# Patient Record
Sex: Female | Born: 2000 | Race: Black or African American | Hispanic: No | Marital: Single | Smoking: Former smoker
Health system: Southern US, Community
[De-identification: ages and names within clinical notes are randomized; demographics above are authoritative.]

## PROBLEM LIST (undated history)

## (undated) ENCOUNTER — Inpatient Hospital Stay (HOSPITAL_COMMUNITY): Payer: Self-pay

## (undated) DIAGNOSIS — O039 Complete or unspecified spontaneous abortion without complication: Secondary | ICD-10-CM

## (undated) DIAGNOSIS — Z789 Other specified health status: Secondary | ICD-10-CM

## (undated) HISTORY — DX: Complete or unspecified spontaneous abortion without complication: O03.9

## (undated) HISTORY — PX: TYMPANOSTOMY TUBE PLACEMENT: SHX32

---
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
## 2018-03-31 ENCOUNTER — Emergency Department (HOSPITAL_COMMUNITY)
Admission: EM | Admit: 2018-03-31 | Discharge: 2018-03-31 | Disposition: A | Discharge status: Home / Self Care | Payer: Medicaid Other | Attending: Physician Assistant | Admitting: Physician Assistant

## 2018-03-31 ENCOUNTER — Other Ambulatory Visit: Payer: Self-pay

## 2018-03-31 ENCOUNTER — Emergency Department (HOSPITAL_COMMUNITY): Payer: Medicaid Other

## 2018-03-31 ENCOUNTER — Encounter (HOSPITAL_COMMUNITY): Payer: Self-pay | Admitting: Emergency Medicine

## 2018-03-31 DIAGNOSIS — R079 Chest pain, unspecified: Secondary | ICD-10-CM | POA: Diagnosis present

## 2018-03-31 DIAGNOSIS — R059 Cough, unspecified: Secondary | ICD-10-CM

## 2018-03-31 DIAGNOSIS — R0789 Other chest pain: Secondary | ICD-10-CM

## 2018-03-31 DIAGNOSIS — R05 Cough: Secondary | ICD-10-CM | POA: Diagnosis not present

## 2018-03-31 DIAGNOSIS — Z79899 Other long term (current) drug therapy: Secondary | ICD-10-CM | POA: Diagnosis not present

## 2018-03-31 LAB — CBC
HCT: 40.5 % (ref 36.0–46.0)
HEMOGLOBIN: 12.8 g/dL (ref 12.0–15.0)
MCH: 27.9 pg (ref 26.0–34.0)
MCHC: 31.6 g/dL (ref 30.0–36.0)
MCV: 88.4 fL (ref 78.0–100.0)
Platelets: 345 10*3/uL (ref 150–400)
RBC: 4.58 MIL/uL (ref 3.87–5.11)
RDW: 12.7 % (ref 11.5–15.5)
WBC: 9.1 10*3/uL (ref 4.0–10.5)

## 2018-03-31 LAB — BASIC METABOLIC PANEL
ANION GAP: 9 (ref 5–15)
BUN: 5 mg/dL — AB (ref 6–20)
CALCIUM: 9.1 mg/dL (ref 8.9–10.3)
CO2: 24 mmol/L (ref 22–32)
Chloride: 104 mmol/L (ref 101–111)
Creatinine, Ser: 0.62 mg/dL (ref 0.44–1.00)
GFR calc Af Amer: >60 mL/min (ref >60)
GFR calc non Af Amer: >60 mL/min (ref >60)
GLUCOSE: 115 mg/dL — AB (ref 65–99)
Potassium: 3.6 mmol/L (ref 3.5–5.1)
Sodium: 137 mmol/L (ref 135–145)

## 2018-03-31 LAB — I-STAT TROPONIN, ED: Troponin i, poc: 0 ng/mL (ref 0.00–0.08)

## 2018-03-31 LAB — I-STAT BETA HCG BLOOD, ED (MC, WL, AP ONLY): I-stat hCG, quantitative: <5 m[IU]/mL (ref <5)

## 2018-03-31 MED ORDER — IBUPROFEN 800 MG PO TABS: 800.0000 mg | ORAL_TABLET | Freq: Three times a day (TID) | ORAL | 0 refills | Status: DC | PRN

## 2018-03-31 NOTE — ED Notes (Signed)
Pt stated that she has been using a nasal spray as well as an OTC med "Top Care" for recent congestion - cold

## 2018-03-31 NOTE — ED Triage Notes (Signed)
Pt reports chest pain intermittently x2 days, reports he mom is upstairs admitted to the hospital with a heart attack. Pt appears anxious

## 2018-03-31 NOTE — ED Notes (Signed)
While ambulating pt's oxygen dropped to 95 for a brief second and the heart rate went from 97 up to 110 then went down then up again while walking around [place] hallway once

## 2018-03-31 NOTE — ED Provider Notes (Signed)
[facility] EMERGENCY DEPARTMENT Provider Note   CSN: [id number] Arrival date & time: 03/31/18  1632     History   Chief Complaint Chief Complaint  Patient presents with  . Chest Pain    HPI Carrie Lyons is a 17 y.o. female.  HPI Patient presents to the emergency department with chest discomfort that started 2 days ago.  The patient states that her chest pain lasts for seconds at a time and then goes away.  She states the pain is not constant.  It is of breath or exertional symptoms.  Patient states she did start with a cough 3 days ago.  She states that certain positions do make the pain worse.  Patient states that she did not take any medications prior to arrival for symptoms.  Patient denies having any medical problems.  She states that she does not have any family history of early onset.  The patient denies  shortness of breath, headache,blurred vision, neck pain, fever,  weakness, numbness, dizziness, anorexia, edema, abdominal pain, nausea, vomiting, diarrhea, rash, back pain, dysuria, hematemesis, bloody stool, near syncope, or syncope. History reviewed. No pertinent past medical history.  There are no active problems to display for this patient.   Past Surgical History:  Procedure Laterality Date  . TYMPANOSTOMY TUBE PLACEMENT       OB History   None      Home Medications    Prior to Admission medications   Medication Sig Start Date End Date Taking? Authorizing Provider  ferrous sulfate 325 (65 FE) MG tablet Take 1 tablet (325 mg total) by mouth daily. 08/31/17   Jeannett Senior, PA-C    Family History No family history on file.  Social History Social History   Tobacco Use  . Smoking status: Never Smoker  . Smokeless tobacco: Never Used  Substance Use Topics  . Alcohol use: No  . Drug use: Not Currently     Allergies   Amoxicillin and Penicillins   Review of Systems Review of Systems All other systems negative except as  documented in the HPI. All pertinent positives and negatives as reviewed in the HPI.  Physical Exam Updated Vital Signs BP (!) 151/98 (BP Location: Right Arm)   Pulse (!) 112   Temp 99.2 F (37.3 C)   Resp 20   Ht 5\' 4"  (1.626 m)   Wt 61.2 kg (135 lb)   LMP 03/10/2018   SpO2 100%   BMI 23.17 kg/m   Physical Exam  Constitutional: She is oriented to person, place, and time. She appears well-developed and well-nourished. No distress.  HENT:  Head: Normocephalic and atraumatic.  Mouth/Throat: Oropharynx is clear and moist.  Eyes: Pupils are equal, round, and reactive to light.  Neck: Normal range of motion. Neck supple.  Cardiovascular: Normal rate, regular rhythm and normal heart sounds. Exam reveals no gallop and no friction rub.  No murmur heard. Pulmonary/Chest: Effort normal and breath sounds normal. No respiratory distress. She has no wheezes. She has no rhonchi. She has no rales.  Abdominal: Soft. Bowel sounds are normal. She exhibits no distension. There is no tenderness.  Neurological: She is alert and oriented to person, place, and time. She exhibits normal muscle tone. Coordination normal.  Skin: Skin is warm and dry. Capillary refill takes less than 2 seconds. No rash noted. No erythema.  Psychiatric: She has a normal mood and affect. Her behavior is normal.  Nursing note and vitals reviewed.    ED Treatments /  Results  Labs (all labs ordered are listed, but only abnormal results are displayed) Labs Reviewed  BASIC METABOLIC PANEL - Abnormal; Notable for the following components:      Result Value   Glucose, Bld 115 (*)    BUN 5 (*)    All other components within normal limits  CBC  I-STAT TROPONIN, ED  I-STAT BETA HCG BLOOD, ED ([facility], [facility], [facility] ONLY)    EKG None  Radiology Dg Chest 2 View  Result Date: 03/31/2018 CLINICAL DATA:  Intermittent chest pain for the past 2 days. EXAM: CHEST - 2 VIEW COMPARISON:  08/31/2017. FINDINGS: The heart size and mediastinal  contours are within normal limits. Both lungs are clear. The visualized skeletal structures are unremarkable. IMPRESSION: Normal examination. Electronically Signed   By: Claudie Revering M.D.   On: 03/31/2018 17:43    Procedures Procedures (including critical care time)  Medications Ordered in ED Medications - No data to display   Initial Impression / Assessment and Plan / ED Course  I have reviewed the triage vital signs and the nursing notes.  Pertinent labs & imaging results that were available during my care of the patient were reviewed by me and considered in my medical decision making (see chart for details).   The patient has atypical symptoms for cardiac chest pain.  Patient is PERC negative and low risk based on Wells criteria.  The patient is young and healthy and she does not smoke.  The patient is not on any birth control.  I feel that the patient's symptoms of pain that lasts for seconds at a time seems pretty atypical for any significant intra-thoracic or cardiac issue.  Patient does not have any shortness of breath associated with this.     Final Clinical Impressions(s) / ED Diagnoses   Final diagnoses:  None    ED Discharge Orders    None       Dalia Heading, PA-C 03/31/18 2030    Macarthur Critchley, MD 04/02/18 1553

## 2018-03-31 NOTE — Discharge Instructions (Addendum)
Return here as needed.  Follow-up with a primary doctor.  If you notice any changes or worsening in your condition you will need to be rechecked.

## 2018-10-29 ENCOUNTER — Inpatient Hospital Stay (HOSPITAL_COMMUNITY)
Admission: AD | Admit: 2018-10-29 | Discharge: 2018-10-29 | Disposition: A | Discharge status: Home / Self Care | Payer: BLUE CROSS/BLUE SHIELD | Attending: Obstetrics and Gynecology | Admitting: Obstetrics and Gynecology

## 2018-10-29 ENCOUNTER — Inpatient Hospital Stay (HOSPITAL_COMMUNITY): Payer: BLUE CROSS/BLUE SHIELD

## 2018-10-29 ENCOUNTER — Encounter (HOSPITAL_COMMUNITY): Payer: Self-pay | Admitting: *Deleted

## 2018-10-29 DIAGNOSIS — R109 Unspecified abdominal pain: Secondary | ICD-10-CM | POA: Diagnosis present

## 2018-10-29 DIAGNOSIS — R030 Elevated blood-pressure reading, without diagnosis of hypertension: Secondary | ICD-10-CM | POA: Diagnosis not present

## 2018-10-29 DIAGNOSIS — O26891 Other specified pregnancy related conditions, first trimester: Secondary | ICD-10-CM | POA: Insufficient documentation

## 2018-10-29 DIAGNOSIS — Z87891 Personal history of nicotine dependence: Secondary | ICD-10-CM | POA: Diagnosis not present

## 2018-10-29 DIAGNOSIS — Z3A01 Less than 8 weeks gestation of pregnancy: Secondary | ICD-10-CM | POA: Insufficient documentation

## 2018-10-29 DIAGNOSIS — Z880 Allergy status to penicillin: Secondary | ICD-10-CM | POA: Insufficient documentation

## 2018-10-29 LAB — URINALYSIS, ROUTINE W REFLEX MICROSCOPIC
Bilirubin Urine: NEGATIVE
Glucose, UA: NEGATIVE mg/dL
Hgb urine dipstick: NEGATIVE
KETONES UR: NEGATIVE mg/dL
LEUKOCYTES UA: NEGATIVE
NITRITE: NEGATIVE
PROTEIN: NEGATIVE mg/dL
Specific Gravity, Urine: 1.01 (ref 1.005–1.030)
pH: 7 (ref 5.0–8.0)

## 2018-10-29 LAB — POCT PREGNANCY, URINE: Preg Test, Ur: POSITIVE — AB

## 2018-10-29 LAB — HCG, QUANTITATIVE, PREGNANCY: HCG, BETA CHAIN, QUANT, S: 1049 m[IU]/mL — AB (ref <5)

## 2018-10-29 MED ORDER — PROMETHAZINE HCL 25 MG PO TABS: 25.0000 mg | ORAL_TABLET | Freq: Four times a day (QID) | ORAL | 0 refills | Status: DC | PRN

## 2018-10-29 MED ORDER — PROMETHAZINE HCL 25 MG PO TABS
25.0000 mg | ORAL_TABLET | Freq: Once | ORAL | Status: AC
  Administered 2018-10-29: 25 mg via ORAL
  Filled 2018-10-29: qty 1

## 2018-10-29 NOTE — MAU Provider Note (Signed)
History     CSN: [id number]  Arrival date and time: 10/29/18 4650   First Provider Initiated Contact with Patient 10/29/18 [phone]      Chief Complaint  Patient presents with  . Abdominal Pain  . Nausea   Carrie Lyons is a 18 y.o. G1P0 at [redacted]w[redacted]d by exact LMP who presents for Abdominal Pain and Nausea.  Patient states she has been experiencing her symptoms for about 1 week. She states she is not taking any medications and tries to "sleep it off," and endorses that this works sometimes.  She reports that the abdominal pain is cramping, mainly in the right lower area and is intermittent.  She also reports that the nausea is all day, but she has vomiting in the morning or at night.  Patient denies fever, chills, diarrhea, or problems with urination.  She endorses constipation for the past 2 weeks and reports having a bowel movement yesterday that was not hard to pass. Patient endorses frequent headaches in the last week that last ~1 hour and go away without apparent intervention.  She denies a history of medical issues/concerns, but has not seen a primary care provider in ~2 years.      OB History    Gravida  1   Para      Term      Preterm      AB      Living        SAB      TAB      Ectopic      Multiple      Live Births              No past medical history on file.  Past Surgical History:  Procedure Laterality Date  . TYMPANOSTOMY TUBE PLACEMENT      History reviewed. No pertinent family history.  Social History   Tobacco Use  . Smoking status: Former Research scientist (life sciences)  . Smokeless tobacco: Never Used  Substance Use Topics  . Alcohol use: No  . Drug use: Not Currently    Allergies:  Allergies  Allergen Reactions  . Amoxicillin Hives    Has patient had a PCN reaction causing immediate rash, facial/tongue/throat swelling, SOB or lightheadedness with hypotension: Unknown Has patient had a PCN reaction causing severe rash involving mucus membranes or skin  necrosis: Unknown Has patient had a PCN reaction that required hospitalization: Unknown Has patient had a PCN reaction occurring within the last 10 years: Yes If all of the above answers are "NO", then may proceed with Cephalosporin use.   Marland Kitchen Penicillins Hives    Has patient had a PCN reaction causing immediate rash, facial/tongue/throat swelling, SOB or lightheadedness with hypotension: Unknown Has patient had a PCN reaction causing severe rash involving mucus membranes or skin necrosis: Unknown Has patient had a PCN reaction that required hospitalization: Unknown Has patient had a PCN reaction occurring within the last 10 years: Yes If all of the above answers are "NO", then may proceed with Cephalosporin use.     Medications Prior to Admission  Medication Sig Dispense Refill Last Dose  . ferrous sulfate 325 (65 FE) MG tablet Take 325 mg by mouth daily with breakfast.   Past Month at Unknown time    Review of Systems  Constitutional: Negative for chills and fever.  Eyes: Negative for photophobia and visual disturbance.  Respiratory: Negative for shortness of breath.   Gastrointestinal: Positive for abdominal pain, constipation and nausea. Negative for diarrhea and  vomiting.  Genitourinary: Negative for dysuria, vaginal bleeding and vaginal discharge.  Neurological: Negative for dizziness, light-headedness and headaches.   Physical Exam   Blood pressure (!) 147/85, pulse 80, temperature 98.5 F (36.9 C), temperature source Oral, resp. rate 17, height 5\' 4"  (1.626 m), weight 65.5 kg, last menstrual period 09/16/2018.  Vitals:   10/29/18 0907 10/29/18 0918 10/29/18 0922 10/29/18 1208  BP:  (!) 141/84 (!) 147/85 136/85  Pulse:  74 80 85  Resp:  17  16  Temp:  98.5 F (36.9 C)    TempSrc:  Oral    Weight: 65.5 kg     Height: 5\' 4"  (1.626 m)       Results for orders placed or performed during the hospital encounter of 10/29/18 (from the past 24 hour(s))  Urinalysis, Routine w  reflex microscopic     Status: None   Collection Time: 10/29/18  9:36 AM  Result Value Ref Range   Color, Urine YELLOW YELLOW   APPearance CLEAR CLEAR   Specific Gravity, Urine 1.010 1.005 - 1.030   pH 7.0 5.0 - 8.0   Glucose, UA NEGATIVE NEGATIVE mg/dL   Hgb urine dipstick NEGATIVE NEGATIVE   Bilirubin Urine NEGATIVE NEGATIVE   Ketones, ur NEGATIVE NEGATIVE mg/dL   Protein, ur NEGATIVE NEGATIVE mg/dL   Nitrite NEGATIVE NEGATIVE   Leukocytes, UA NEGATIVE NEGATIVE  Pregnancy, urine POC     Status: Abnormal   Collection Time: 10/29/18  9:41 AM  Result Value Ref Range   Preg Test, Ur POSITIVE (A) NEGATIVE  hCG, quantitative, pregnancy     Status: Abnormal   Collection Time: 10/29/18 12:01 PM  Result Value Ref Range   hCG, Beta Chain, Quant, S 1,049 (H) <5 mIU/mL     Physical Exam  Constitutional: She is oriented to person, place, and time. She appears well-developed and well-nourished. No distress.  HENT:  Head: Normocephalic and atraumatic.  Eyes: Conjunctivae are normal.  Neck: Normal range of motion.  Cardiovascular: Normal rate, regular rhythm and normal heart sounds.  Respiratory: Effort normal and breath sounds normal.  GI: Soft. Bowel sounds are normal. She exhibits no distension. There is no abdominal tenderness. There is no rebound.  Musculoskeletal: Normal range of motion.        General: No edema.  Neurological: She is alert and oriented to person, place, and time.  Skin: Skin is warm and dry.  Psychiatric: She has a normal mood and affect. Her behavior is normal.    MAU Course  Procedures US FINDINGS: Intrauterine gestational sac: Visualized  Yolk sac:  Not visualized  Embryo:  Not visualized  Cardiac Activity: Not visualized  MSD: 6 mm   5 w   2 d  Subchorionic hemorrhage:  None visualized.  Maternal uterus/adnexae: Cervical os is closed. Right ovary measures 2.6 x 1.7 x 2.0 cm in size. Left ovary measures 3.8 x 2.1 x 3.4 cm in size. No  extrauterine pelvic mass. There is a small amount of free pelvic fluid.  IMPRESSION: Probable early intrauterine gestational sac, but no yolk sac, fetal pole, or cardiac activity yet visualized. Recommend follow-up quantitative B-HCG levels and follow-up US in 14 days to assess viability. This recommendation follows SRU consensus guidelines:  Diagnostic Criteria for Nonviable Pregnancy Early in the First Trimester. [employer] Med 2013; 628:3662-94. based on gestational sac size, estimated gestational age is 5+ weeks.  Small amount of free fluid in the cul-de-sac which may be upper physiologic. No extrauterine pelvic mass.  No intrauterine lesions beyond apparent small gestational sac.  MDM PE AntiEmetic Viability [place] Education Labs: CBC, hCG,  Assessment and Plan  +UPT at 6.1wks by Definite LMP Right Side Abdominal Pain Nausea Elevated BP  -Informed of positive UPT, congratulations given -Discussed phenergan usage for symptoms of nausea -Discussed need for [place] to confirm IUP based on definite LMP -Educated on elevated blood pressure at this visit and past visits to [facility] facility (03/2018 and 08/2017). *Patient reports no history of blood pressure issues *Mother at bedside,  reports personal history of heart attack -Informed that BP will need to be monitored and further elevations would warrant a formal diagnosis of CHTN -Will send for [place] and follow up once results return.   Follow Up (11:59 AM) IUP at 5.2wks   -[place] results discussed. -Will obtain quant -Informed of need for follow up with hCG and [place]. -FU quant scheduled for Wednes. Jan 22 at 1100am at [facility]. -Rx for PNV and phenergan 25mg  q6hrs prn, Disp 30, RF 0 -Discussed obtaining primary obstetrician and extended invitation for care at [facility]. -No questions/concerns -Encouraged to call or return to MAU if symptoms worsen or with the onset of new symptoms. -Discharged to home in stable condition  [name],  [name] 10/29/2018, 9:54 AM

## 2018-10-29 NOTE — Progress Notes (Signed)
Notify RN

## 2018-10-29 NOTE — MAU Note (Signed)
Patient c/o lower abdominal cramping and nausea for past week.  Has not vomited, but felt nauseated.  Unsure if pregnant. Has not taken HPT.  LMP 09/16/18.

## 2018-10-29 NOTE — Discharge Instructions (Signed)
First Trimester of Pregnancy °The first trimester of pregnancy is from week 1 until the end of week 13 (months 1 through 3). A week after a sperm fertilizes an egg, the egg will implant on the wall of the uterus. This embryo will begin to develop into a baby. Genes from you and your partner will form the baby. The female genes will determine whether the baby will be a boy or a girl. At 6-8 weeks, the eyes and face will be formed, and the heartbeat can be seen on ultrasound. At the end of 12 weeks, all the baby's organs will be formed. °Now that you are pregnant, you will want to do everything you can to have a healthy baby. Two of the most important things are to get good prenatal care and to follow your health care provider's instructions. Prenatal care is all the medical care you receive before the baby's birth. This care will help prevent, find, and treat any problems during the pregnancy and childbirth. °Body changes during your first trimester °Your body goes through many changes during pregnancy. The changes vary from woman to woman. °· You may gain or lose a couple of pounds at first. °· You may feel sick to your stomach (nauseous) and you may throw up (vomit). If the vomiting is uncontrollable, call your health care provider. °· You may tire easily. °· You may develop headaches that can be relieved by medicines. All medicines should be approved by your health care provider. °· You may urinate more often. Painful urination may mean you have a bladder infection. °· You may develop heartburn as a result of your pregnancy. °· You may develop constipation because certain hormones are causing the muscles that push stool through your intestines to slow down. °· You may develop hemorrhoids or swollen veins (varicose veins). °· Your breasts may begin to grow larger and become tender. Your nipples may stick out more, and the tissue that surrounds them (areola) may become darker. °· Your gums may bleed and may be  sensitive to brushing and flossing. °· Dark spots or blotches (chloasma, mask of pregnancy) may develop on your face. This will likely fade after the baby is born. °· Your menstrual periods will stop. °· You may have a loss of appetite. °· You may develop cravings for certain kinds of food. °· You may have changes in your emotions from day to day, such as being excited to be pregnant or being concerned that something may go wrong with the pregnancy and baby. °· You may have more vivid and strange dreams. °· You may have changes in your hair. These can include thickening of your hair, rapid growth, and changes in texture. Some women also have hair loss during or after pregnancy, or hair that feels dry or thin. Your hair will most likely return to normal after your baby is born. °What to expect at prenatal visits °During a routine prenatal visit: °· You will be weighed to make sure you and the baby are growing normally. °· Your blood pressure will be taken. °· Your abdomen will be measured to track your baby's growth. °· The fetal heartbeat will be listened to between weeks 10 and 14 of your pregnancy. °· Test results from any previous visits will be discussed. °Your health care provider may ask you: °· How you are feeling. °· If you are feeling the baby move. °· If you have had any abnormal symptoms, such as leaking fluid, bleeding, severe headaches, or abdominal   cramping. °· If you are using any tobacco products, including cigarettes, chewing tobacco, and electronic cigarettes. °· If you have any questions. °Other tests that may be performed during your first trimester include: °· Blood tests to find your blood type and to check for the presence of any previous infections. The tests will also be used to check for low iron levels (anemia) and protein on red blood cells (Rh antibodies). Depending on your risk factors, or if you previously had diabetes during pregnancy, you may have tests to check for high blood sugar  that affects pregnant women (gestational diabetes). °· Urine tests to check for infections, diabetes, or protein in the urine. °· An ultrasound to confirm the proper growth and development of the baby. °· Fetal screens for spinal cord problems (spina bifida) and Down syndrome. °· HIV (human immunodeficiency virus) testing. Routine prenatal testing includes screening for HIV, unless you choose not to have this test. °· You may need other tests to make sure you and the baby are doing well. °Follow these instructions at home: °Medicines °· Follow your health care provider's instructions regarding medicine use. Specific medicines may be either safe or unsafe to take during pregnancy. °· Take a prenatal vitamin that contains at least 600 micrograms (mcg) of folic acid. °· If you develop constipation, try taking a stool softener if your health care provider approves. °Eating and drinking ° °· Eat a balanced diet that includes fresh fruits and vegetables, whole grains, good sources of protein such as meat, eggs, or tofu, and low-fat dairy. Your health care provider will help you determine the amount of weight gain that is right for you. °· Avoid raw meat and uncooked cheese. These carry germs that can cause birth defects in the baby. °· Eating four or five small meals rather than three large meals a day may help relieve nausea and vomiting. If you start to feel nauseous, eating a few soda crackers can be helpful. Drinking liquids between meals, instead of during meals, also seems to help ease nausea and vomiting. °· Limit foods that are high in fat and processed sugars, such as fried and sweet foods. °· To prevent constipation: °? Eat foods that are high in fiber, such as fresh fruits and vegetables, whole grains, and beans. °? Drink enough fluid to keep your urine clear or pale yellow. °Activity °· Exercise only as directed by your health care provider. Most women can continue their usual exercise routine during  pregnancy. Try to exercise for 30 minutes at least 5 days a week. Exercising will help you: °? Control your weight. °? Stay in shape. °? Be prepared for labor and delivery. °· Experiencing pain or cramping in the lower abdomen or lower back is a good sign that you should stop exercising. Check with your health care provider before continuing with normal exercises. °· Try to avoid standing for long periods of time. Move your legs often if you must stand in one place for a long time. °· Avoid heavy lifting. °· Wear low-heeled shoes and practice good posture. °· You may continue to have sex unless your health care provider tells you not to. °Relieving pain and discomfort °· Wear a good support bra to relieve breast tenderness. °· Take warm sitz baths to soothe any pain or discomfort caused by hemorrhoids. Use hemorrhoid cream if your health care provider approves. °· Rest with your legs elevated if you have leg cramps or low back pain. °· If you develop varicose veins in   your legs, wear support hose. Elevate your feet for 15 minutes, 3-4 times a day. Limit salt in your diet. Prenatal care  Schedule your prenatal visits by the twelfth week of pregnancy. They are usually scheduled monthly at first, then more often in the last 2 months before delivery.  Write down your questions. Take them to your prenatal visits.  Keep all your prenatal visits as told by your health care provider. This is important. Safety  Wear your seat belt at all times when driving.  Make a list of emergency phone numbers, including numbers for family, friends, the hospital, and police and fire departments. General instructions  Ask your health care provider for a referral to a local prenatal education class. Begin classes no later than the beginning of month 6 of your pregnancy.  Ask for help if you have counseling or nutritional needs during pregnancy. Your health care provider can offer advice or refer you to specialists for help  with various needs.  Do not use hot tubs, steam rooms, or saunas.  Do not douche or use tampons or scented sanitary pads.  Do not cross your legs for long periods of time.  Avoid cat litter boxes and soil used by cats. These carry germs that can cause birth defects in the baby and possibly loss of the fetus by miscarriage or stillbirth.  Avoid all smoking, herbs, alcohol, and medicines not prescribed by your health care provider. Chemicals in these products affect the formation and growth of the baby.  Do not use any products that contain nicotine or tobacco, such as cigarettes and e-cigarettes. If you need help quitting, ask your health care provider. You may receive counseling support and other resources to help you quit.  Schedule a dentist appointment. At home, brush your teeth with a soft toothbrush and be gentle when you floss. Contact a health care provider if:  You have dizziness.  You have mild pelvic cramps, pelvic pressure, or nagging pain in the abdominal area.  You have persistent nausea, vomiting, or diarrhea.  You have a bad smelling vaginal discharge.  You have pain when you urinate.  You notice increased swelling in your face, hands, legs, or ankles.  You are exposed to fifth disease or chickenpox.  You are exposed to [place] measles (rubella) and have never had it. Get help right away if:  You have a fever.  You are leaking fluid from your vagina.  You have spotting or bleeding from your vagina.  You have severe abdominal cramping or pain.  You have rapid weight gain or loss.  You vomit blood or material that looks like coffee grounds.  You develop a severe headache.  You have shortness of breath.  You have any kind of trauma, such as from a fall or a car accident. Summary  The first trimester of pregnancy is from week 1 until the end of week 13 (months 1 through 3).  Your body goes through many changes during pregnancy. The changes vary from  woman to woman.  You will have routine prenatal visits. During those visits, your health care provider will examine you, discuss any test results you may have, and talk with you about how you are feeling. This information is not intended to replace advice given to you by your health care provider. Make sure you discuss any questions you have with your health care provider. Document Released: 09/20/2001 Document Revised: 09/07/2016 Document Reviewed: 09/07/2016 Elsevier Interactive Patient Education  2019 [employer] Abdominal Pain  During Pregnancy  Abdominal pain is common during pregnancy, and has many possible causes. Some causes are more serious than others, and sometimes the cause is not known. Abdominal pain can be a sign that labor is starting. It can also be caused by normal growth and stretching of muscles and ligaments during pregnancy. Always tell your health care provider if you have any abdominal pain. Follow these instructions at home:  Do not have sex or put anything in your vagina until your pain goes away completely.  Get plenty of rest until your pain improves.  Drink enough fluid to keep your urine pale yellow.  Take over-the-counter and prescription medicines only as told by your health care provider.  Keep all follow-up visits as told by your health care provider. This is important. Contact a health care provider if:  Your pain continues or gets worse after resting.  You have lower abdominal pain that: ? Comes and goes at regular intervals. ? Spreads to your back. ? Is similar to menstrual cramps.  You have pain or burning when you urinate. Get help right away if:  You have a fever or chills.  You have vaginal bleeding.  You are leaking fluid from your vagina.  You are passing tissue from your vagina.  You have vomiting or diarrhea that lasts for more than 24 hours.  Your baby is moving less than usual.  You feel very weak or faint.  You have  shortness of breath.  You develop severe pain in your upper abdomen. Summary  Abdominal pain is common during pregnancy, and has many possible causes.  If you experience abdominal pain during pregnancy, tell your health care provider right away.  Follow your health care provider's home care instructions and keep all follow-up visits as directed. This information is not intended to replace advice given to you by your health care provider. Make sure you discuss any questions you have with your health care provider. Document Released: 09/26/2005 Document Revised: 12/29/2016 Document Reviewed: 12/29/2016 Elsevier Interactive Patient Education  2019 [employer].

## 2018-10-31 ENCOUNTER — Ambulatory Visit (INDEPENDENT_AMBULATORY_CARE_PROVIDER_SITE_OTHER): Payer: BLUE CROSS/BLUE SHIELD | Admitting: General Practice

## 2018-10-31 DIAGNOSIS — O3680X0 Pregnancy with inconclusive fetal viability, not applicable or unspecified: Secondary | ICD-10-CM

## 2018-10-31 LAB — HCG, QUANTITATIVE, PREGNANCY: HCG, BETA CHAIN, QUANT, S: 1802 m[IU]/mL — AB (ref <5)

## 2018-10-31 NOTE — Progress Notes (Signed)
Patient presents to office today for stat bhcg following recent MAU visit. Patient reports pain is only minimal now & it comes and goes. Patient denies bleeding. Discussed with patient we are monitoring your bhcg levels today & asked she wait in lobby for results/updated plan of care. Patient verbalized understanding & had no questions at this time.   Reviewed results with Dr Hulan Fray who finds appropriate rise in bhcg levels, patient should have follow up ultrasound in 1 week.   Informed patient of results & discussed recommendations. Ultrasound scheduled 1/28 due to patient availability. Ectopic precautions reviewed. Patient verbalized understanding & had no questions.  Koren Bound RN BSN 10/31/18

## 2018-10-31 NOTE — Progress Notes (Signed)
I have reviewed this chart and agree with the RN/CMA assessment and management.    Dell Hurtubise C Layia Walla, MD, FACOG Attending Physician, [facility]   

## 2018-11-06 ENCOUNTER — Ambulatory Visit (INDEPENDENT_AMBULATORY_CARE_PROVIDER_SITE_OTHER): Payer: BLUE CROSS/BLUE SHIELD | Admitting: General Practice

## 2018-11-06 ENCOUNTER — Ambulatory Visit (HOSPITAL_COMMUNITY)
Admission: RE | Admit: 2018-11-06 | Discharge: 2018-11-06 | Disposition: A | Discharge status: Home / Self Care | Payer: BLUE CROSS/BLUE SHIELD | Source: Ambulatory Visit | Attending: Obstetrics & Gynecology | Admitting: Obstetrics & Gynecology

## 2018-11-06 ENCOUNTER — Encounter: Payer: Self-pay | Admitting: Family Medicine

## 2018-11-06 DIAGNOSIS — O3680X0 Pregnancy with inconclusive fetal viability, not applicable or unspecified: Secondary | ICD-10-CM | POA: Insufficient documentation

## 2018-11-06 DIAGNOSIS — Z712 Person consulting for explanation of examination or test findings: Secondary | ICD-10-CM

## 2018-11-06 NOTE — Progress Notes (Signed)
Patient presents to office today for viability ultrasound results. Reviewed results with Dr Elly Modena who finds not much progression since last ultrasound, still no yolk sac or embryo, which is suspicious for failed pregnancy- patient should have bhcg today. Will call patient with results tomorrow and updated plan of care.   Informed patient of results & discussed bhcg today. Discussed we will call her tomorrow with results/updated plan of care. Patient verbalized understanding & had no questions.  Koren Bound RN BSN 11/06/18

## 2018-11-07 LAB — BETA HCG QUANT (REF LAB): hCG Quant: 3023 m[IU]/mL

## 2018-11-08 ENCOUNTER — Telehealth: Payer: Self-pay | Admitting: *Deleted

## 2018-11-08 DIAGNOSIS — O3680X0 Pregnancy with inconclusive fetal viability, not applicable or unspecified: Secondary | ICD-10-CM

## 2018-11-08 NOTE — Telephone Encounter (Addendum)
-----   Message from Mora Bellman, MD sent at 11/07/2018  4:20 PM EST ----- Please inform patient of a rise in HCG. She needs to come in for a repeat ultrasound in 7-10 days from her previous to confirm viability of the pregnancy and to discuss results with a provider.  I scheduled [place] and called Yocelyn and notified her of results per Dr. Elly Modena . I also gave her the [place] appointment and to come to the office afterwards for results. She voices understanding.  Marit Goodwill,RN

## 2018-11-10 ENCOUNTER — Inpatient Hospital Stay (HOSPITAL_COMMUNITY)
Admission: AD | Admit: 2018-11-10 | Discharge: 2018-11-10 | Disposition: A | Discharge status: Home / Self Care | Payer: BLUE CROSS/BLUE SHIELD | Attending: Obstetrics and Gynecology | Admitting: Obstetrics and Gynecology

## 2018-11-10 ENCOUNTER — Encounter (HOSPITAL_COMMUNITY): Payer: Self-pay

## 2018-11-10 ENCOUNTER — Inpatient Hospital Stay (EMERGENCY_DEPARTMENT_HOSPITAL)
Admission: AD | Admit: 2018-11-10 | Discharge: 2018-11-10 | Disposition: A | Discharge status: Home / Self Care | Payer: BLUE CROSS/BLUE SHIELD | Source: Ambulatory Visit | Attending: Obstetrics and Gynecology | Admitting: Obstetrics and Gynecology

## 2018-11-10 ENCOUNTER — Inpatient Hospital Stay (HOSPITAL_COMMUNITY): Payer: BLUE CROSS/BLUE SHIELD

## 2018-11-10 DIAGNOSIS — Z880 Allergy status to penicillin: Secondary | ICD-10-CM | POA: Insufficient documentation

## 2018-11-10 DIAGNOSIS — Z87891 Personal history of nicotine dependence: Secondary | ICD-10-CM

## 2018-11-10 DIAGNOSIS — O209 Hemorrhage in early pregnancy, unspecified: Secondary | ICD-10-CM

## 2018-11-10 DIAGNOSIS — Z3A01 Less than 8 weeks gestation of pregnancy: Secondary | ICD-10-CM

## 2018-11-10 DIAGNOSIS — N898 Other specified noninflammatory disorders of vagina: Secondary | ICD-10-CM | POA: Diagnosis not present

## 2018-11-10 DIAGNOSIS — O200 Threatened abortion: Secondary | ICD-10-CM | POA: Insufficient documentation

## 2018-11-10 HISTORY — DX: Other specified health status: Z78.9

## 2018-11-10 LAB — TYPE AND SCREEN
ABO/RH(D): B POS
Antibody Screen: NEGATIVE

## 2018-11-10 LAB — WET PREP, GENITAL
Clue Cells Wet Prep HPF POC: NONE SEEN
Sperm: NONE SEEN
TRICH WET PREP: NONE SEEN
YEAST WET PREP: NONE SEEN

## 2018-11-10 LAB — CBC
HCT: 36.8 % (ref 36.0–46.0)
HEMOGLOBIN: 11.7 g/dL — AB (ref 12.0–15.0)
MCH: 27.1 pg (ref 26.0–34.0)
MCHC: 31.8 g/dL (ref 30.0–36.0)
MCV: 85.2 fL (ref 80.0–100.0)
NRBC: 0 % (ref 0.0–0.2)
PLATELETS: 326 10*3/uL (ref 150–400)
RBC: 4.32 MIL/uL (ref 3.87–5.11)
RDW: 15 % (ref 11.5–15.5)
WBC: 5.6 10*3/uL (ref 4.0–10.5)

## 2018-11-10 LAB — URINALYSIS, ROUTINE W REFLEX MICROSCOPIC
Bilirubin Urine: NEGATIVE
GLUCOSE, UA: NEGATIVE mg/dL
Ketones, ur: NEGATIVE mg/dL
LEUKOCYTES UA: NEGATIVE
Nitrite: NEGATIVE
PH: 7 (ref 5.0–8.0)
Protein, ur: NEGATIVE mg/dL
SPECIFIC GRAVITY, URINE: 1.015 (ref 1.005–1.030)

## 2018-11-10 LAB — ABO/RH: ABO/RH(D): B POS

## 2018-11-10 LAB — URINALYSIS, MICROSCOPIC (REFLEX)

## 2018-11-10 LAB — HCG, QUANTITATIVE, PREGNANCY: hCG, Beta Chain, Quant, S: 5824 m[IU]/mL — ABNORMAL HIGH (ref <5)

## 2018-11-10 NOTE — MAU Note (Signed)
Carrie Lyons is a 18 y.o. at [redacted]w[redacted]d here in MAU reporting:  Increased lower abdominal cramping and vaginal bleeding. Reports she is now having quarter to fifty cent piece sized clots. Was seen in MAU earlier today. Pain score: 8/10 Vitals:   11/10/18 1648  BP: 136/76  Pulse: (!) 108  Resp: 18  Temp: 98 F (36.7 C)  SpO2: 99%      Lab orders placed from triage: none

## 2018-11-10 NOTE — MAU Provider Note (Signed)
History     CSN: [id number]  Arrival date and time: 11/10/18 6962   First Provider Initiated Contact with Patient 11/10/18 [phone]      Chief Complaint  Patient presents with  . Vaginal Bleeding   Elizebeth MAGIE CIAMPA is a 18 y.o. G1P0 at [redacted]w[redacted]d who presents for Vaginal Bleeding. She states she awoke this morning and noted some pinkish blood when wiping. She denies recent IC and has no issues with constipation, diarrhea, or urination.  She also denies cramping, pelvic or abdominal pain. Patient reports that she did not note any vaginal bleeding or discharge upon arrival to the MAU.      OB History    Gravida  1   Para      Term      Preterm      AB      Living        SAB      TAB      Ectopic      Multiple      Live Births              History reviewed. No pertinent past medical history.  Past Surgical History:  Procedure Laterality Date  . TYMPANOSTOMY TUBE PLACEMENT      History reviewed. No pertinent family history.  Social History   Tobacco Use  . Smoking status: Former Research scientist (life sciences)  . Smokeless tobacco: Never Used  Substance Use Topics  . Alcohol use: No  . Drug use: Not Currently    Allergies:  Allergies  Allergen Reactions  . Amoxicillin Hives    Has patient had a PCN reaction causing immediate rash, facial/tongue/throat swelling, SOB or lightheadedness with hypotension: Unknown Has patient had a PCN reaction causing severe rash involving mucus membranes or skin necrosis: Unknown Has patient had a PCN reaction that required hospitalization: Unknown Has patient had a PCN reaction occurring within the last 10 years: Yes If all of the above answers are "NO", then may proceed with Cephalosporin use.   Marland Kitchen Penicillins Hives    Has patient had a PCN reaction causing immediate rash, facial/tongue/throat swelling, SOB or lightheadedness with hypotension: Unknown Has patient had a PCN reaction causing severe rash involving mucus membranes or skin necrosis:  Unknown Has patient had a PCN reaction that required hospitalization: Unknown Has patient had a PCN reaction occurring within the last 10 years: Yes If all of the above answers are "NO", then may proceed with Cephalosporin use.     Medications Prior to Admission  Medication Sig Dispense Refill Last Dose  . ferrous sulfate 325 (65 FE) MG tablet Take 325 mg by mouth daily with breakfast.   Past Month at Unknown time  . promethazine (PHENERGAN) 25 MG tablet Take 1 tablet (25 mg total) by mouth every 6 (six) hours as needed for nausea or vomiting. 30 tablet 0     Review of Systems  Constitutional: Negative for chills and fever.  Gastrointestinal: Negative for abdominal pain, constipation, diarrhea, nausea and vomiting.  Genitourinary: Positive for vaginal bleeding. Negative for dysuria and pelvic pain.  Neurological: Negative for dizziness, light-headedness and headaches.   Physical Exam   Blood pressure 137/87, pulse 87, temperature 99.1 F (37.3 C), temperature source Oral, resp. rate 18, height 5\' 4"  (1.626 m), weight 68.2 kg, last menstrual period 09/16/2018.  Physical Exam  Vitals reviewed. Constitutional: She is oriented to person, place, and time. She appears well-developed and well-nourished. No distress.  HENT:  Head: Normocephalic and atraumatic.  Eyes: Conjunctivae are normal.  Neck: Normal range of motion.  Cardiovascular: Normal rate, regular rhythm and normal heart sounds.  Respiratory: Effort normal and breath sounds normal.  GI: Soft. Bowel sounds are normal. She exhibits no distension. There is no abdominal tenderness.  Genitourinary: Uterus is enlarged. Cervix exhibits no motion tenderness, no discharge and no friability.    Vaginal bleeding present.     No vaginal discharge or tenderness.  There is bleeding in the vagina. No tenderness in the vagina.    Genitourinary Comments: Speculum Exam: -Vaginal Vault: Pink mucosa.  Moderate amt dark red blood in vault -wet  prep collected -Cervix:Pink, no lesions, cysts, or polyps.  Appears closed. No active bleeding from os-GC/CT collected -Bimanual Exam: Closed/Long/Thick No tenderness in Cul de sac.    Musculoskeletal: Normal range of motion.        General: No edema.  Neurological: She is alert and oriented to person, place, and time.  Skin: Skin is warm and dry.  Psychiatric: She has a normal mood and affect. Her behavior is normal.    MAU Course  Procedures Results for orders placed or performed during the hospital encounter of 11/10/18 (from the past 24 hour(s))  Wet prep, genital     Status: Abnormal   Collection Time: 11/10/18  9:56 AM  Result Value Ref Range   Yeast Wet Prep HPF POC NONE SEEN NONE SEEN   Trich, Wet Prep NONE SEEN NONE SEEN   Clue Cells Wet Prep HPF POC NONE SEEN NONE SEEN   WBC, Wet Prep HPF POC FEW (A) NONE SEEN   Sperm NONE SEEN   hCG, quantitative, pregnancy     Status: Abnormal   Collection Time: 11/10/18 10:16 AM  Result Value Ref Range   hCG, Beta Chain, Quant, S 5,824 (H) <5 mIU/mL  CBC     Status: Abnormal   Collection Time: 11/10/18 10:16 AM  Result Value Ref Range   WBC 5.6 4.0 - 10.5 K/uL   RBC 4.32 3.87 - 5.11 MIL/uL   Hemoglobin 11.7 (L) 12.0 - 15.0 g/dL   HCT 36.8 36.0 - 46.0 %   MCV 85.2 80.0 - 100.0 fL   MCH 27.1 26.0 - 34.0 pg   MCHC 31.8 30.0 - 36.0 g/dL   RDW 15.0 11.5 - 15.5 %   Platelets 326 150 - 400 K/uL   nRBC 0.0 0.0 - 0.2 %  Type and screen     Status: None   Collection Time: 11/10/18 10:16 AM  Result Value Ref Range   ABO/RH(D) B POS    Antibody Screen NEG    Sample Expiration      11/13/2018 Performed at [facility], [address] [place], [place] [address]     FINDINGS: Intrauterine gestational sac: Single  Yolk sac:  Not Visualized.  Embryo:  Not Visualized.  Cardiac Activity: Not applicable  MSD: 7.7 mm   5 w   3 d  Subchorionic hemorrhage:  None visualized.  Maternal uterus/adnexae: Small uterine mass  measuring 9 x 7 x 8 mm consistent with a fibroid. No other uterine masses. Cervix is closed.  Ovaries and adnexa are unremarkable.  No abnormal pelvic free fluid  IMPRESSION: 1. Gestational sac with no yolk sac or embryo, with the gestational sac less irregular than on the prior study but without significant overall change in size. This remains suspicious for a failed intrauterine pregnancy. Findings are suspicious but not yet definitive for failed pregnancy. Recommend follow-up US in 10-14  days for definitive diagnosis. This recommendation follows SRU consensus guidelines: Diagnostic Criteria for Nonviable Pregnancy Early in the First Trimester. [place] Med 2013; 276:1470-92.   2. 9 mm uterine fibroid.  No other abnormalities.  MDM Pelvic exam with cultures Labs:UA, CBC, HcG, T&S, WP, GC/CT TVUS  Assessment and Plan  IUP at 7wks Vaginal Bleeding  -Exam findings discussed -WP Pending -GC/CT collected and sent -Send for [place]  -Will await results   Follow Up (11:46 AM) IUP vs Failed IUP Rh Positive   -[place] and hcG results discussed. -Discussed elevated hCG, but no visualization of yolk sac or embryo -Plan to repeat US in 10-14 days per recommendation; patient agreeable -Will send new [place] order for cancellation of scheduled [place] on 2/6 and reschedule to ~11/20/2018. -Bleeding precautions given -Encouraged pelvic rest in setting of vaginal bleeding and unknown pregnancy outcome.  -Discussed necessity of obtaining PCP for blood pressure evaluation and management.  Informed that BP is abnormal and reflective of CHTN. -Requests work Loss adjuster, chartered. -No other questions or concerns -Encouraged to call or return to MAU if symptoms worsen or with the onset of new symptoms. -Discharged to home in stable condition  [name], [name] 11/10/2018, 9:42 AM

## 2018-11-10 NOTE — MAU Note (Signed)
Carrie Lyons is a 18 y.o. at [redacted]w[redacted]d here in MAU reporting: vaginal bleeding. States she saw blood this AM when using the bathroom. Blood dripped into the toilet and saw some when she wiped. Has not seen any bleeding since. No pain, no recent IC  Onset of complaint: this AM  Lab orders placed from triage: UA ordered, pt unable to void at this time

## 2018-11-10 NOTE — MAU Provider Note (Signed)
History     CSN: [id number]  Arrival date and time: 11/10/18 1605   First Provider Initiated Contact with Patient 11/10/18 1848      Chief Complaint  Patient presents with  . Vaginal Bleeding  . Abdominal Pain   Carrie Lyons is a 18 y.o. G1P0 at [redacted]w[redacted]d who presents for Vaginal Bleeding and Abdominal Pain.  She states that she started to experience increased bleeding and cramping around 1430.  She states she also passed some large clots, but is unsure of if a gestational sac was passed.       OB History    Gravida  1   Para      Term      Preterm      AB      Living        SAB      TAB      Ectopic      Multiple      Live Births              Past Medical History:  Diagnosis Date  . Medical history non-contributory     Past Surgical History:  Procedure Laterality Date  . TYMPANOSTOMY TUBE PLACEMENT      No family history on file.  Social History   Tobacco Use  . Smoking status: Former Research scientist (life sciences)  . Smokeless tobacco: Never Used  Substance Use Topics  . Alcohol use: No  . Drug use: Not Currently    Allergies:  Allergies  Allergen Reactions  . Amoxicillin Hives    Has patient had a PCN reaction causing immediate rash, facial/tongue/throat swelling, SOB or lightheadedness with hypotension: Unknown Has patient had a PCN reaction causing severe rash involving mucus membranes or skin necrosis: Unknown Has patient had a PCN reaction that required hospitalization: Unknown Has patient had a PCN reaction occurring within the last 10 years: Yes If all of the above answers are "NO", then may proceed with Cephalosporin use.   Marland Kitchen Penicillins Hives    Has patient had a PCN reaction causing immediate rash, facial/tongue/throat swelling, SOB or lightheadedness with hypotension: Unknown Has patient had a PCN reaction causing severe rash involving mucus membranes or skin necrosis: Unknown Has patient had a PCN reaction that required hospitalization:  Unknown Has patient had a PCN reaction occurring within the last 10 years: Yes If all of the above answers are "NO", then may proceed with Cephalosporin use.     Medications Prior to Admission  Medication Sig Dispense Refill Last Dose  . ferrous sulfate 325 (65 FE) MG tablet Take 325 mg by mouth daily with breakfast.   Past Month at Unknown time  . promethazine (PHENERGAN) 25 MG tablet Take 1 tablet (25 mg total) by mouth every 6 (six) hours as needed for nausea or vomiting. 30 tablet 0     Review of Systems  Constitutional: Negative for chills and fever.  Respiratory: Negative for shortness of breath.   Gastrointestinal: Negative for nausea and vomiting.  Genitourinary: Positive for pelvic pain and vaginal bleeding. Negative for dysuria.   Physical Exam   Blood pressure 136/76, pulse (!) 108, temperature 98 F (36.7 C), temperature source Oral, resp. rate 18, last menstrual period 09/16/2018, SpO2 99 %.  Physical Exam  Constitutional: She is oriented to person, place, and time. She appears well-developed and well-nourished. No distress.  HENT:  Head: Normocephalic and atraumatic.  Eyes: Conjunctivae are normal.  Neck: Normal range of motion.  Cardiovascular: Normal rate, regular  rhythm and normal heart sounds.  Respiratory: Effort normal.  GI: Soft.  Genitourinary:    Vaginal bleeding present.  There is bleeding in the vagina.    Genitourinary Comments:  Speculum Exam: -Vaginal Vault: Pink mucosa. Moderate amt dark red blood noted  -Cervix:Pink, no lesions, cysts, or polyps.  Appears closed. Active bleeding from os -Bimanual Exam: Closed. No Fundal or Tenderness in cul de sac.   Musculoskeletal: Normal range of motion.  Neurological: She is alert and oriented to person, place, and time.  Skin: Skin is warm and dry.    MAU Course  Procedures  MDM Pelvic Exam  Assessment and Plan  Threatened vs Inevitable Abortion  -Exam findings discussed. -Informed that findings  are suspicious for miscarriage considering inability to visualize embryo or yolk sac despite proper rise in quant levels and active bleeding from cervix. -FOB questions likelihood of embryo being discovered at later ultrasound and informed that chances are likely, but current and previous findings remain suspicious.   -Reassurances given that cervix remains closed and bleeding could be from unidentified source not visualized on ultrasound. -Reassured that care plan will remain in place to confirm viability or failure of pregnancy. -Will cancel order for repeat US in 10-14 days and keep [place] for 11/15/2018 as scheduled -Bleeding precautions given -Discussed possible occurrences if miscarriage was to take place including increased bleeding, passing of gestational sac, cramping/pain, nausea/vomiting. -Informed of safety of tylenol dosing for cramping and discomfort if it returns. -Offered tylenol for cramping and patient declines at current. -Encouraged to call or return to MAU if symptoms worsen or with the onset of new symptoms. -Discharged to home in stable condition  Carrie Conners MSN, [name] 11/10/2018, 6:48 PM

## 2018-11-10 NOTE — Discharge Instructions (Signed)
Miscarriage °A miscarriage is the loss of an unborn baby (fetus) before the 20th week of pregnancy. Most miscarriages happen during the first 3 months of pregnancy. Sometimes, a miscarriage can happen before a woman knows that she is pregnant. °Having a miscarriage can be an emotional experience. If you have had a miscarriage, talk with your health care provider about any questions you may have about miscarrying, the grieving process, and your plans for future pregnancy. °What are the causes? °A miscarriage may be caused by: °· Problems with the genes or chromosomes of the fetus. These problems make it impossible for the baby to develop normally. They are often the result of random errors that occur early in the development of the baby, and are not passed from parent to child (not inherited). °· Infection of the cervix or uterus. °· Conditions that affect hormone balance in the body. °· Problems with the cervix, such as the cervix opening and thinning before pregnancy is at term (cervical insufficiency). °· Problems with the uterus. These may include: °? A uterus with an abnormal shape. °? Fibroids in the uterus. °? Congenital abnormalities. These are problems that were present at birth. °· Certain medical conditions. °· Smoking, drinking alcohol, or using drugs. °· Injury (trauma). °In many cases, the cause of a miscarriage is not known. °What are the signs or symptoms? °Symptoms of this condition include: °· Vaginal bleeding or spotting, with or without cramps or pain. °· Pain or cramping in the abdomen or lower back. °· Passing fluid, tissue, or blood clots from the vagina. °How is this diagnosed? °This condition may be diagnosed based on: °· A physical exam. °· Ultrasound. °· Blood tests. °· Urine tests. °How is this treated? °Treatment for a miscarriage is sometimes not necessary if you naturally pass all the tissue that was in your uterus. If necessary, this condition may be treated with: °· Dilation and  curettage (D&C). This is a procedure in which the cervix is stretched open and the lining of the uterus (endometrium) is scraped. This is done only if tissue from the fetus or placenta remains in the body (incomplete miscarriage). °· Medicines, such as: °? Antibiotic medicine, to treat infection. °? Medicine to help the body pass any remaining tissue. °? Medicine to reduce (contract) the size of the uterus. These medicines may be given if you have a lot of bleeding. °If you have Rh negative blood and your baby was Rh positive, you will need a shot of a medicine called Rh immunoglobulinto protect your future babies from Rh blood problems. "Rh-negative" and "Rh-positive" refer to whether or not the blood has a specific protein found on the surface of red blood cells (Rh factor). °Follow these instructions at home: °Medicines ° °· Take over-the-counter and prescription medicines only as told by your health care provider. °· If you were prescribed antibiotic medicine, take it as told by your health care provider. Do not stop taking the antibiotic even if you start to feel better. °· Do not take NSAIDs, such as aspirin and ibuprofen, unless they are approved by your health care provider. These medicines can cause bleeding. °Activity °· Rest as directed. Ask your health care provider what activities are safe for you. °· Have someone help with home and family responsibilities during this time. °General instructions °· Keep track of the number of sanitary pads you use each day and how soaked (saturated) they are. Write down this information. °· Monitor the amount of tissue or blood clots that   help with home and family responsibilities during this time.  General instructions  · Keep track of the number of sanitary pads you use each day and how soaked (saturated) they are. Write down this information.  · Monitor the amount of tissue or blood clots that you pass from your vagina. Save any large amounts of tissue for your health care provider to examine.  · Do not use tampons, douche, or have sex until your health care provider approves.  · To help you and your partner with the process of grieving, talk with your health care provider or seek counseling.  · When you are ready, meet with  your health care provider to discuss any important steps you should take for your health. Also, discuss steps you should take to have a healthy pregnancy in the future.  · Keep all follow-up visits as told by your health care provider. This is important.  Where to find more information  · The American Congress of Obstetricians and Gynecologists: www.acog.org  · U.[place] Department of Health and Human Services Office of Women’s Health: www.womenshealth.gov  Contact a health care provider if:  · You have a fever or chills.  · You have a foul smelling vaginal discharge.  · You have more bleeding instead of less.  Get help right away if:  · You have severe cramps or pain in your back or abdomen.  · You pass blood clots or tissue from your vagina that is walnut-sized or larger.  · You soak more than 1 regular sanitary pad in an hour.  · You become light-headed or weak.  · You pass out.  · You have feelings of sadness that take over your thoughts, or you have thoughts of hurting yourself.  Summary  · Most miscarriages happen in the first 3 months of pregnancy. Sometimes miscarriage happens before a woman even knows that she is pregnant.  · Follow your health care provider's instruction for home care. Keep all follow-up appointments.  · To help you and your partner with the process of grieving, talk with your health care provider or seek counseling.  This information is not intended to replace advice given to you by your health care provider. Make sure you discuss any questions you have with your health care provider.  Document Released: 03/22/2001 Document Revised: 11/01/2016 Document Reviewed: 11/01/2016  Elsevier Interactive Patient Education © 2019 [employer]

## 2018-11-12 ENCOUNTER — Other Ambulatory Visit: Payer: Self-pay | Admitting: *Deleted

## 2018-11-12 DIAGNOSIS — O3680X0 Pregnancy with inconclusive fetal viability, not applicable or unspecified: Secondary | ICD-10-CM

## 2018-11-12 LAB — GC/CHLAMYDIA PROBE AMP (~~LOC~~) NOT AT ARMC
Chlamydia: NEGATIVE
Neisseria Gonorrhea: NEGATIVE

## 2018-11-12 NOTE — Progress Notes (Signed)
I have reviewed the chart and agree with nursing staff's documentation of this patient's encounter.  Mora Bellman, MD 11/12/2018 11:18 AM

## 2018-11-14 ENCOUNTER — Other Ambulatory Visit: Payer: BLUE CROSS/BLUE SHIELD

## 2018-11-15 ENCOUNTER — Ambulatory Visit: Payer: BLUE CROSS/BLUE SHIELD

## 2018-11-15 ENCOUNTER — Ambulatory Visit (HOSPITAL_COMMUNITY): Payer: BLUE CROSS/BLUE SHIELD

## 2018-11-15 ENCOUNTER — Ambulatory Visit: Payer: BLUE CROSS/BLUE SHIELD | Admitting: Obstetrics and Gynecology

## 2018-11-20 ENCOUNTER — Ambulatory Visit (HOSPITAL_COMMUNITY): Payer: BLUE CROSS/BLUE SHIELD | Attending: Obstetrics and Gynecology

## 2018-12-01 IMAGING — CR DG CHEST 2V
2 series · 2 of 2 positions shown · non-contrast
Comparison: None.

CLINICAL DATA: Elevated heart rate.  Weakness.

EXAM:
CHEST  2 VIEW

[chest lat]
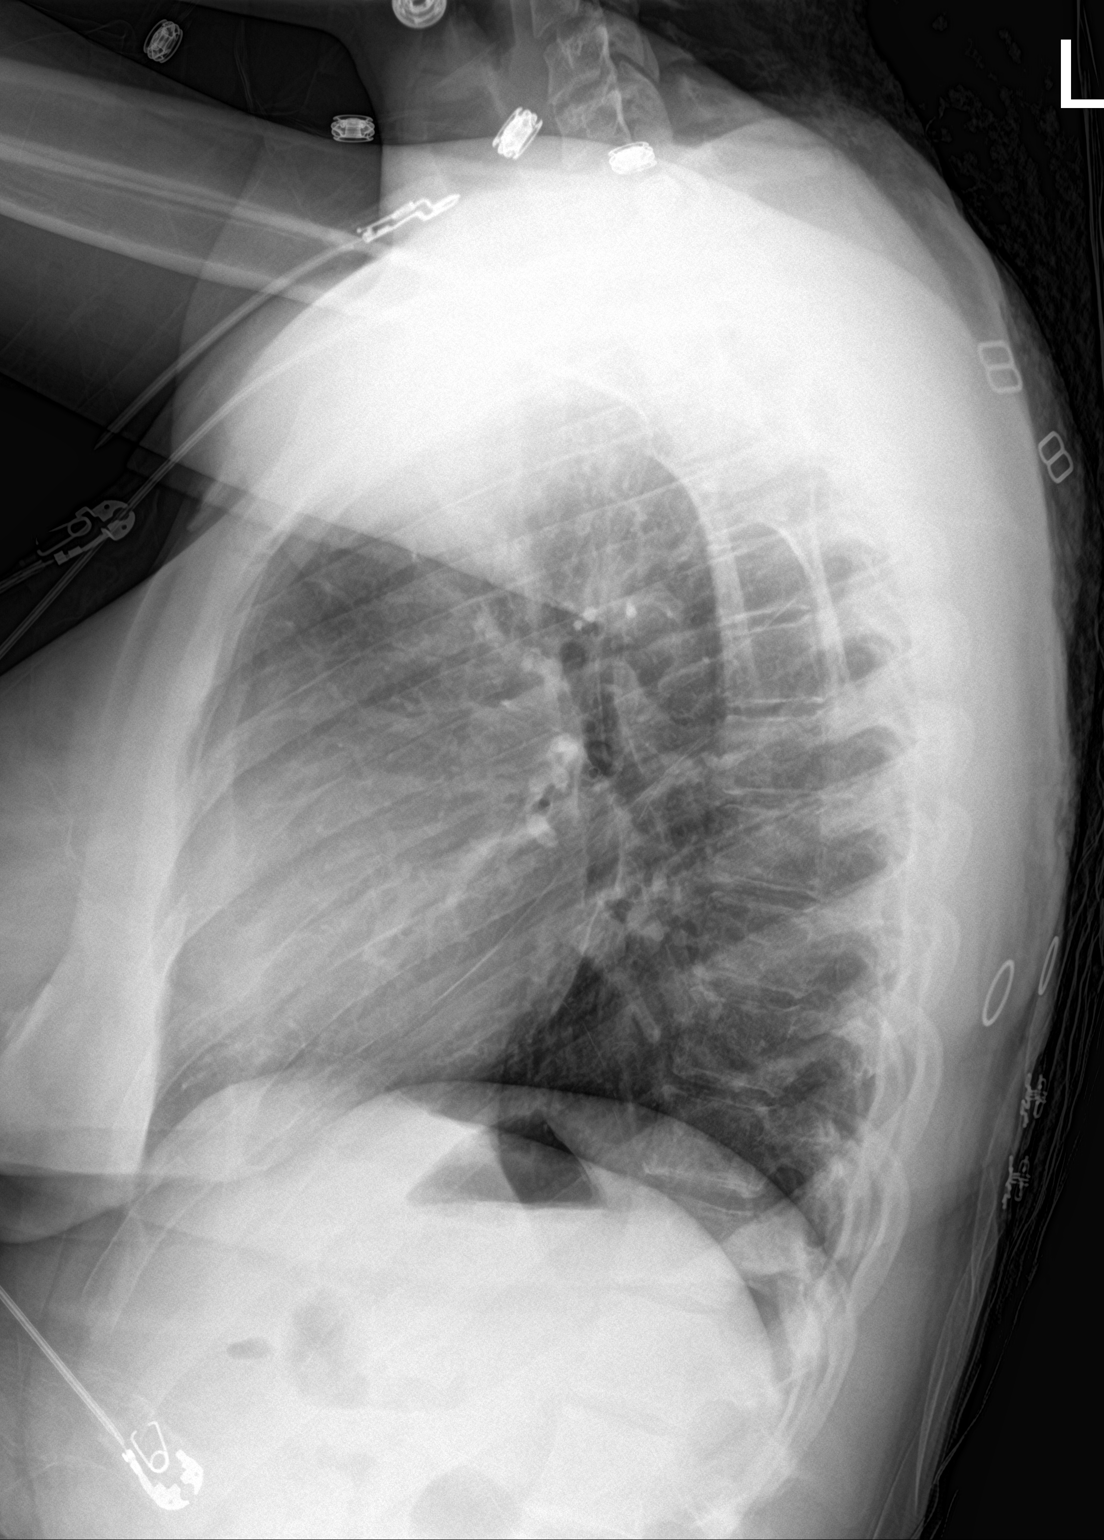

[chest ap]
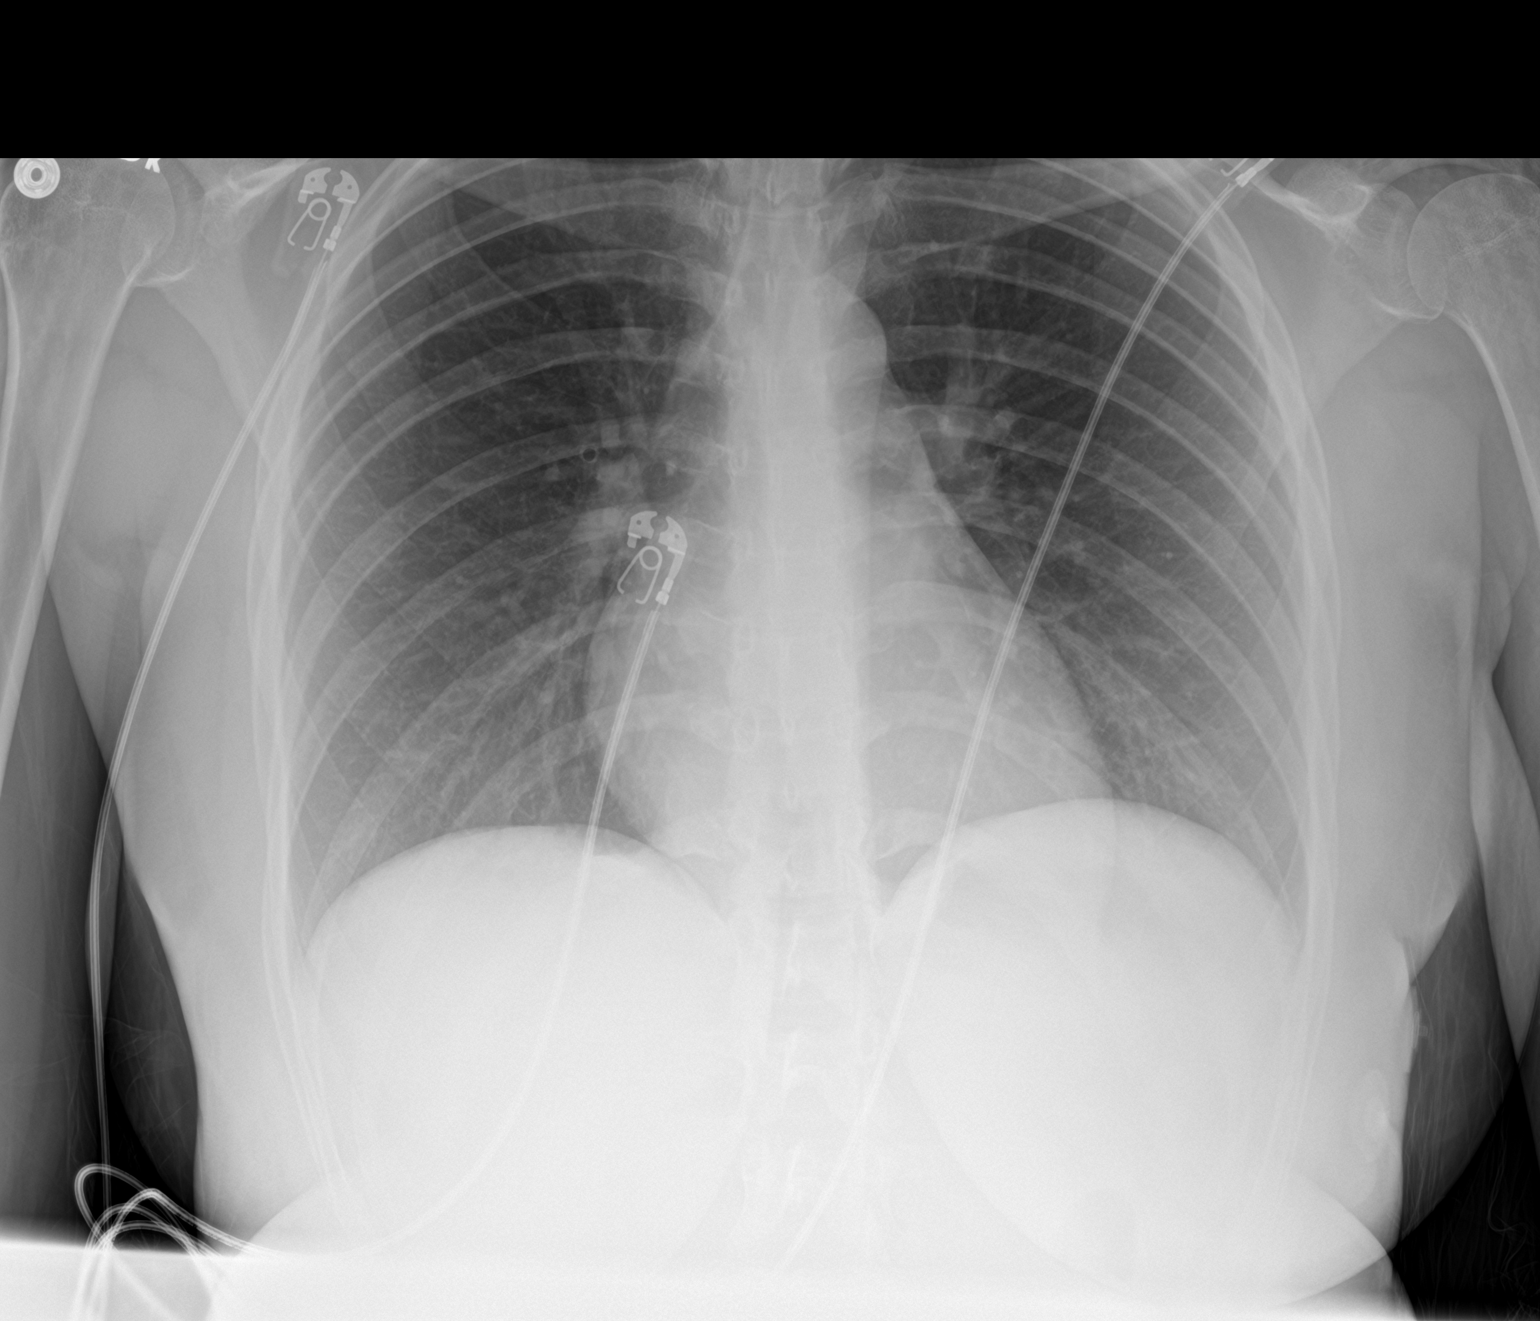

[2 of 2 positions shown; findings below may reference images not displayed]

FINDINGS: The cardiomediastinal contours are normal. The lungs are clear.
Pulmonary vasculature is normal. No consolidation, pleural effusion,
or pneumothorax. No acute osseous abnormalities are seen.
IMPRESSION: No acute pulmonary process.

## 2020-01-29 IMAGING — US US OB < 14 WEEKS - US OB TV
1 series · 15 of 28 positions shown · non-contrast
Comparison: None.

CLINICAL DATA: Pelvic pain

EXAM:
OBSTETRIC <14 WK US AND TRANSVAGINAL OB US
TECHNIQUE: Both transabdominal and transvaginal ultrasound examinations were
performed for complete evaluation of the gestation as well as the
maternal uterus, adnexal regions, and pelvic cul-de-sac.
Transvaginal technique was performed to assess early pregnancy.

[Series 1: us ob < 14 weeks - us ob tv · 49 acquisitions, 15 frames shown]
[im 1/49]
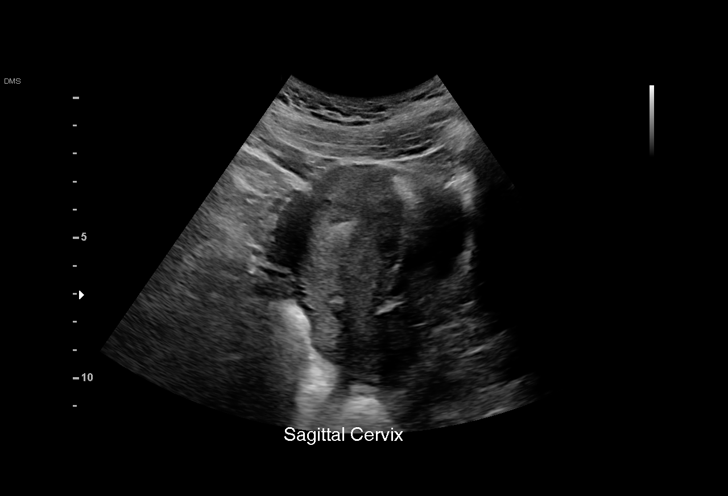
[im 4/49]
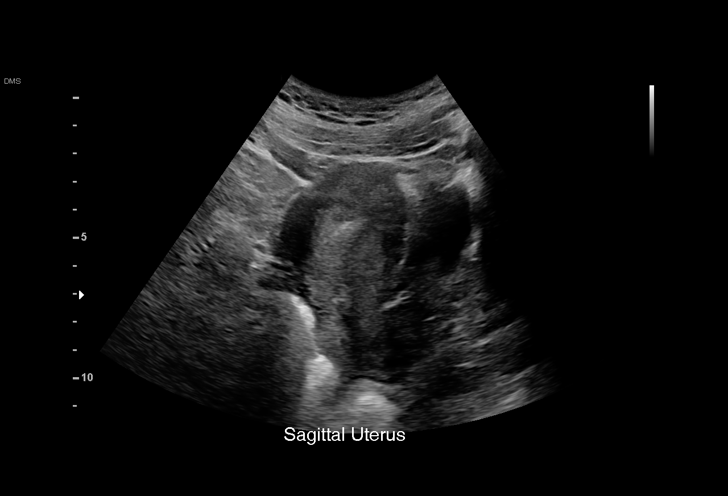
[im 8/49]
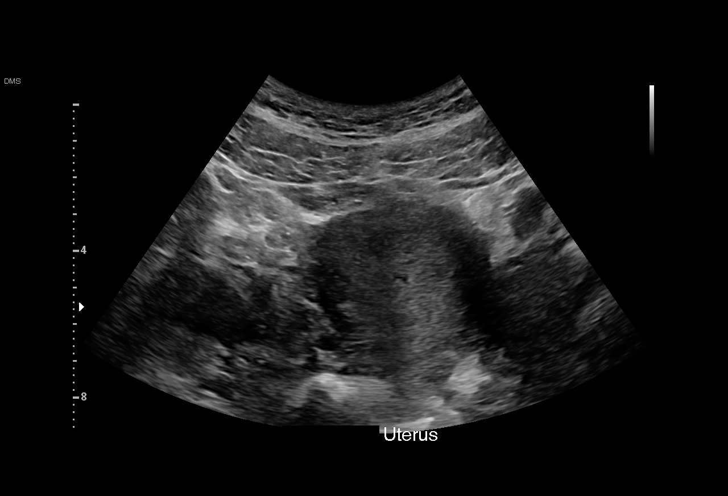
[im 11/49]
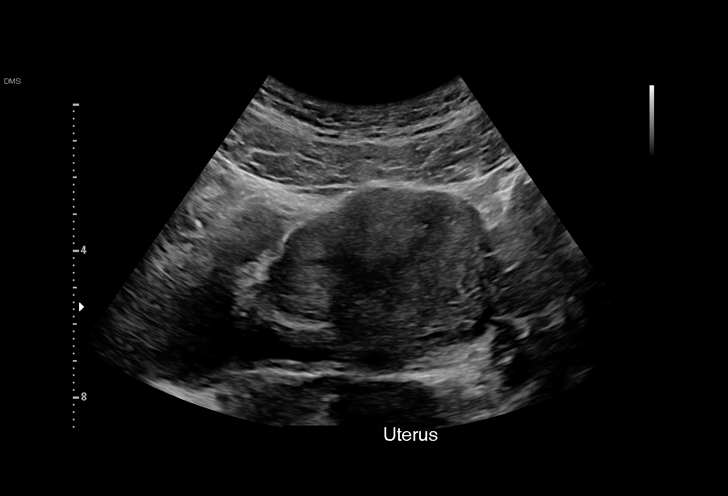
[im 15/49]
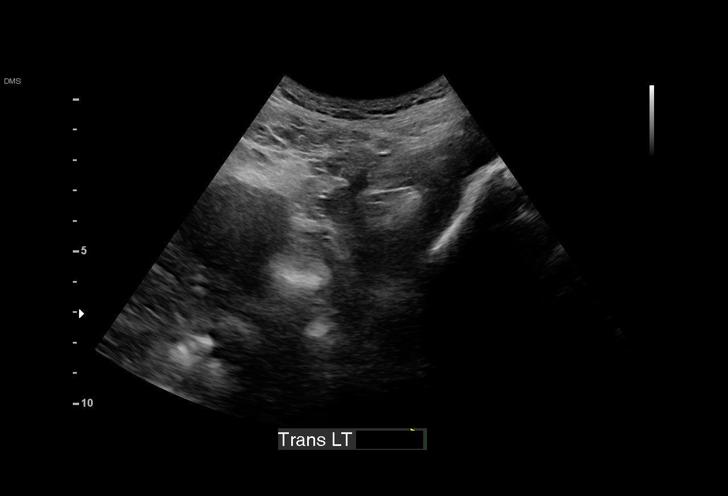
[im 18/49]
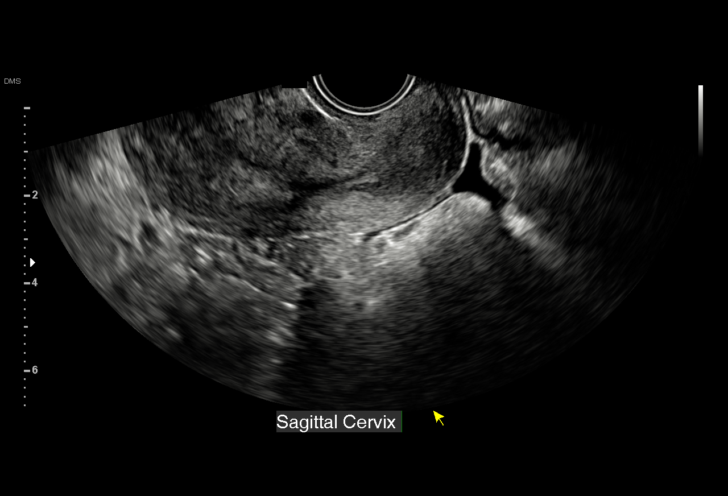
[im 22/49]
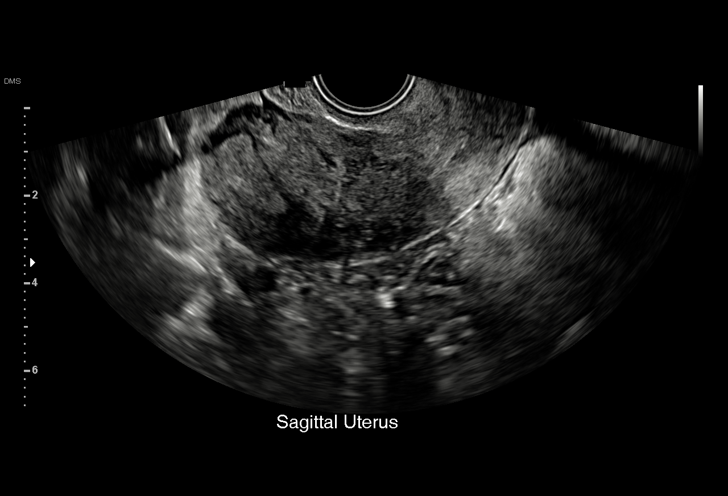
[im 25/49]
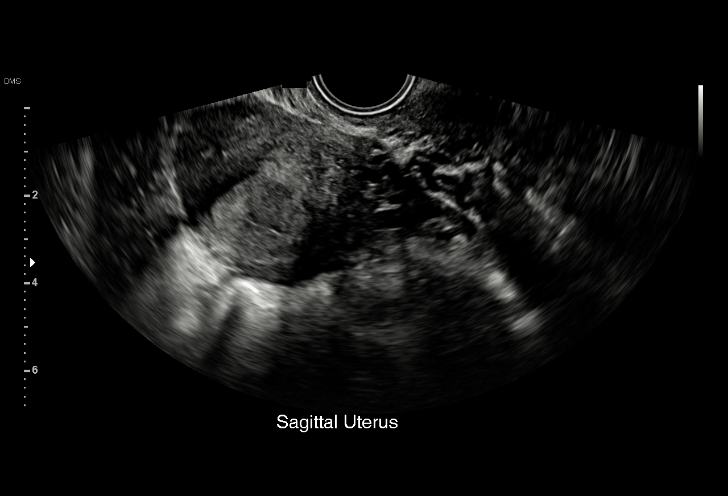
[im 27/49]
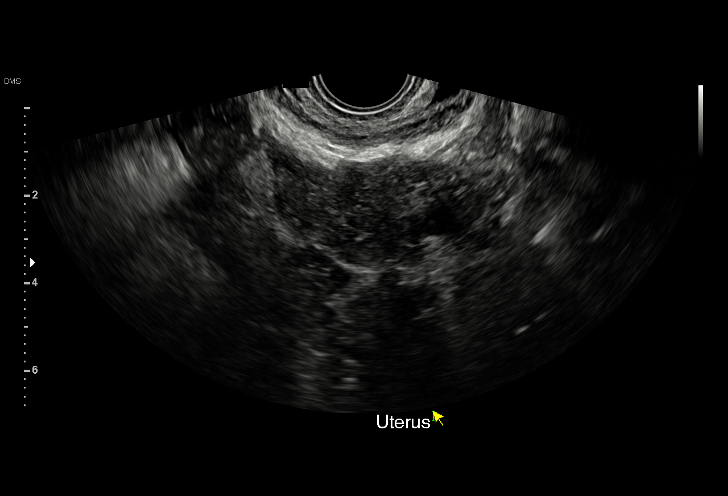
[im 31/49]
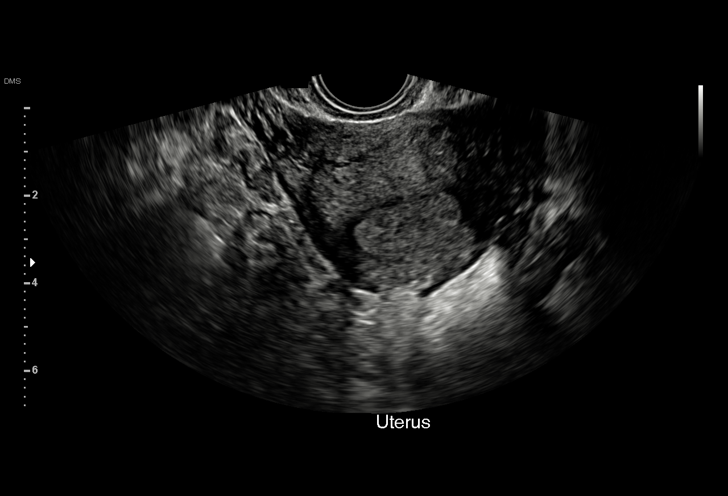
[im 34/49]
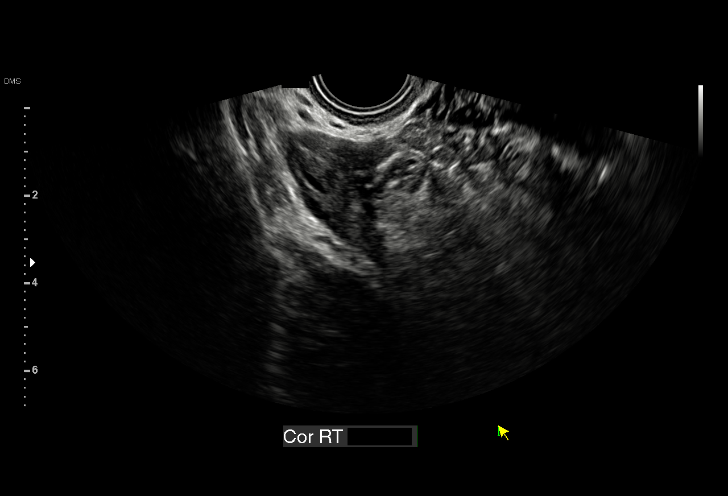
[im 38/49]
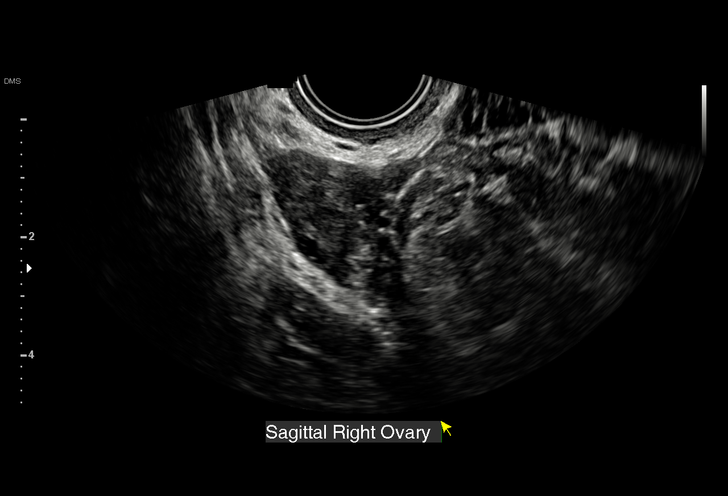
[im 41/49]
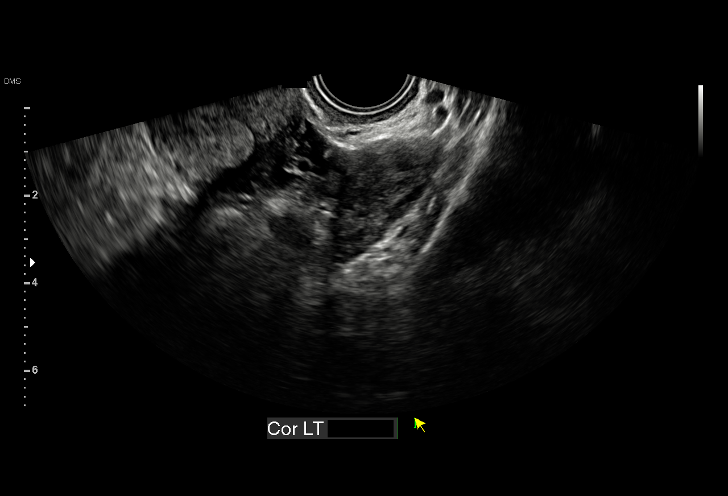
[im 45/49]
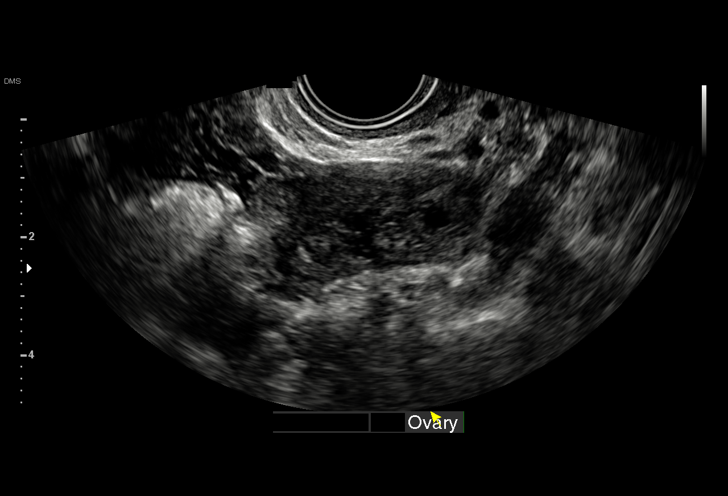
[im 49/49]
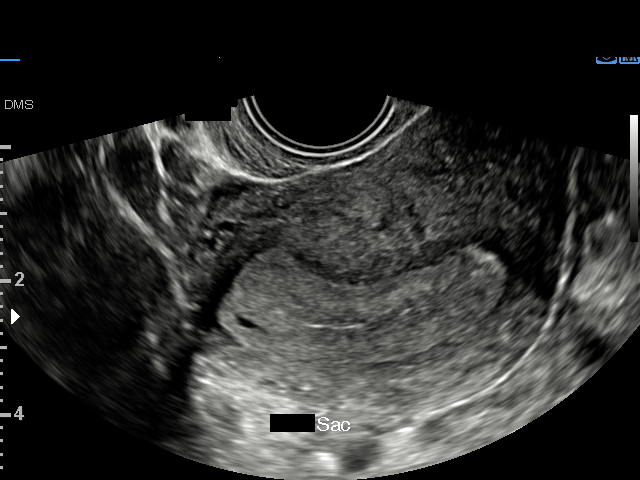

[15 of 28 positions shown; findings below may reference images not displayed]

FINDINGS: Intrauterine gestational sac: Visualized

Yolk sac:  Not visualized

Embryo:  Not visualized

Cardiac Activity: Not visualized

MSD: 6 mm   5 w   2 d

Subchorionic hemorrhage:  None visualized.

Maternal uterus/adnexae: Cervical os is closed. Right ovary measures
2.6 x 1.7 x 2.0 cm in size. Left ovary measures 3.8 x 2.1 x 3.4 cm
in size. No extrauterine pelvic mass. There is a small amount of
free pelvic fluid.
IMPRESSION: Probable early intrauterine gestational sac, but no yolk sac, fetal
pole, or cardiac activity yet visualized. Recommend follow-up
quantitative B-HCG levels and follow-up US in 14 days to assess
viability. This recommendation follows SRU consensus guidelines:
Diagnostic Criteria for Nonviable Pregnancy Early in the First
Trimester. N Engl J Med 8205; [DATE]. based on gestational sac
size, estimated gestational age is 5+ weeks.

Small amount of free fluid in the cul-de-sac which may be upper
physiologic. No extrauterine pelvic mass.

No intrauterine lesions beyond apparent small gestational sac.

## 2020-02-06 IMAGING — US US OB TRANSVAGINAL
1 series · 15 of 28 positions shown · non-contrast
Comparison: 10/29/2018

CLINICAL DATA: 1st trimester pregnancy of unknown anatomic
location.

EXAM:
OBSTETRIC <14 WK ULTRASOUND
TECHNIQUE: Transabdominal ultrasound was performed for evaluation of the
gestation as well as the maternal uterus and adnexal regions.

[Series 1: us ob transvaginal · 59 acquisitions, 15 frames shown]
[im 1/59]
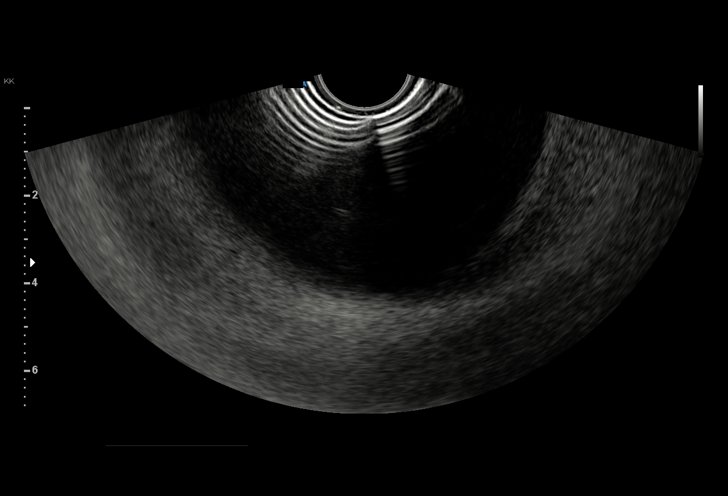
[im 5/59]
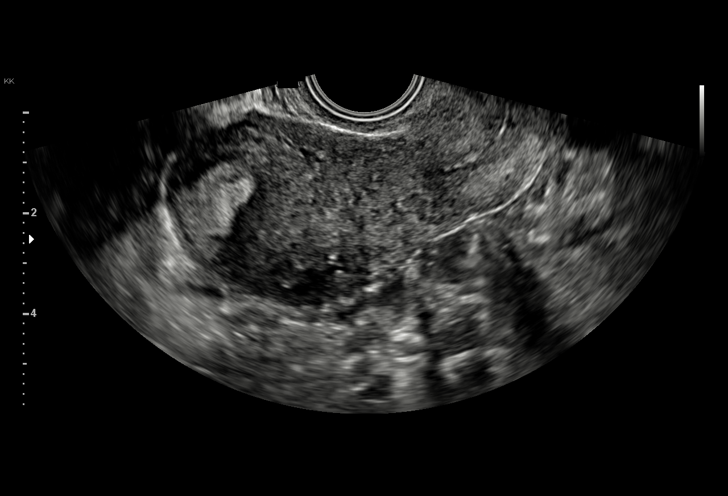
[im 9/59]
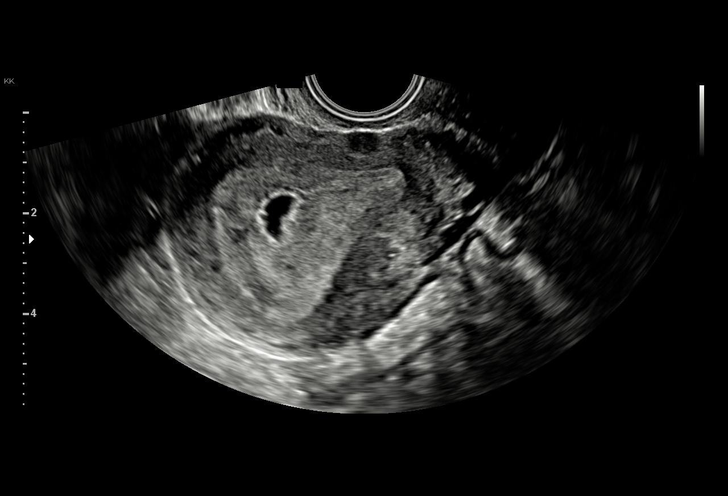
[im 13/59]
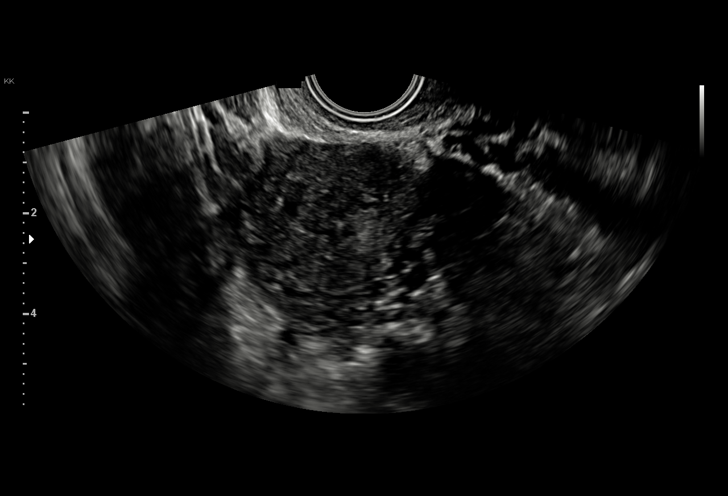
[im 18/59]
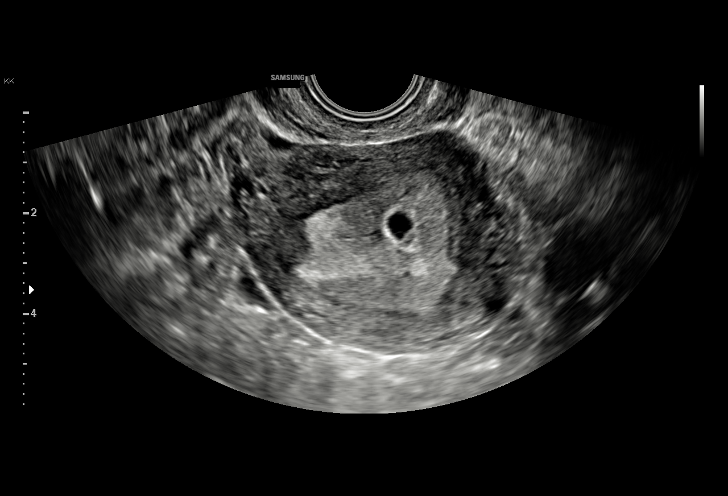
[im 22/59]
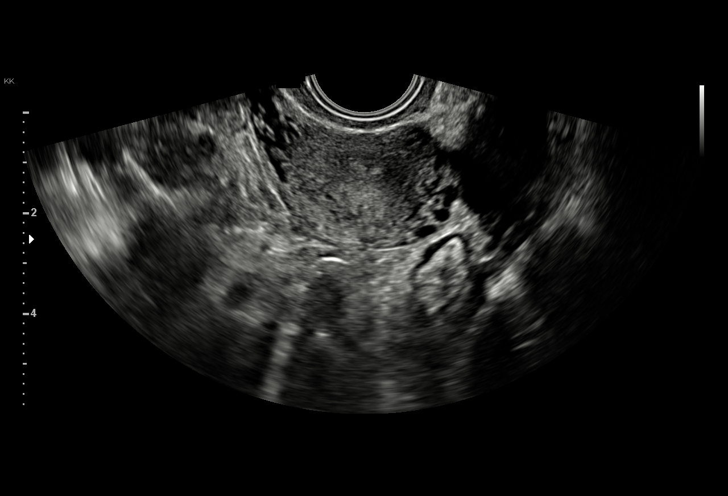
[im 26/59]
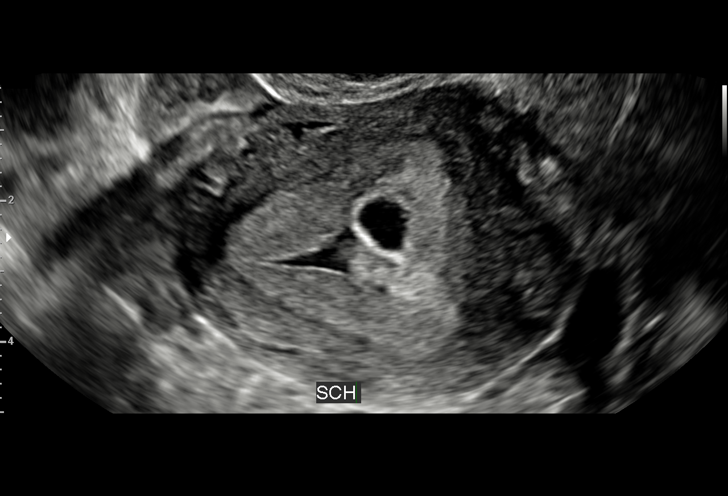
[im 31/59]
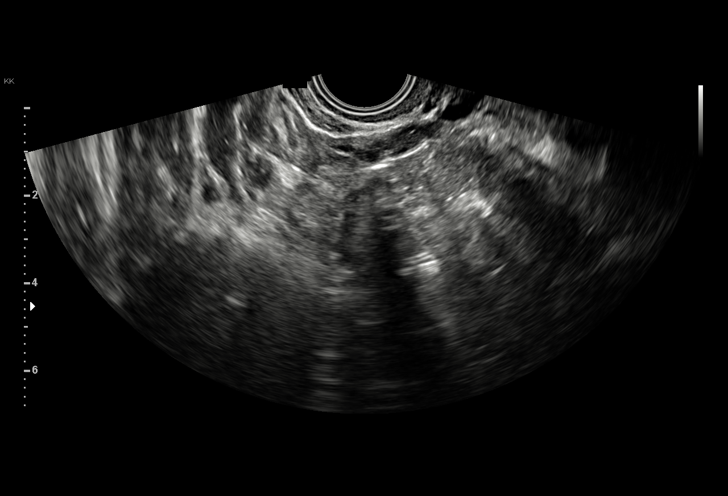
[im 33/59]
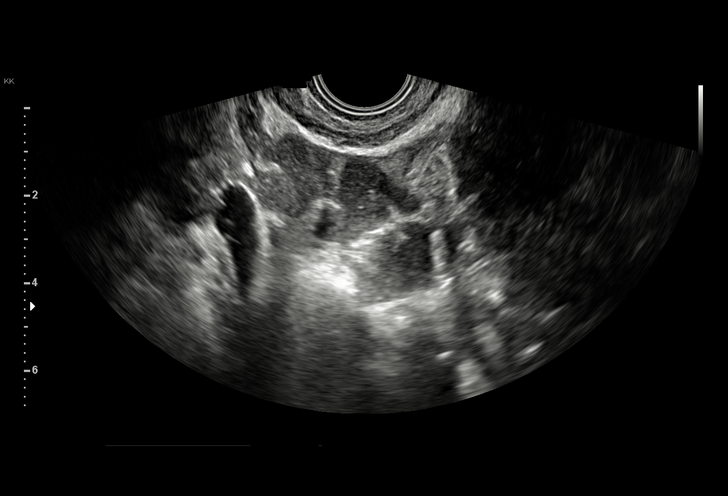
[im 37/59]
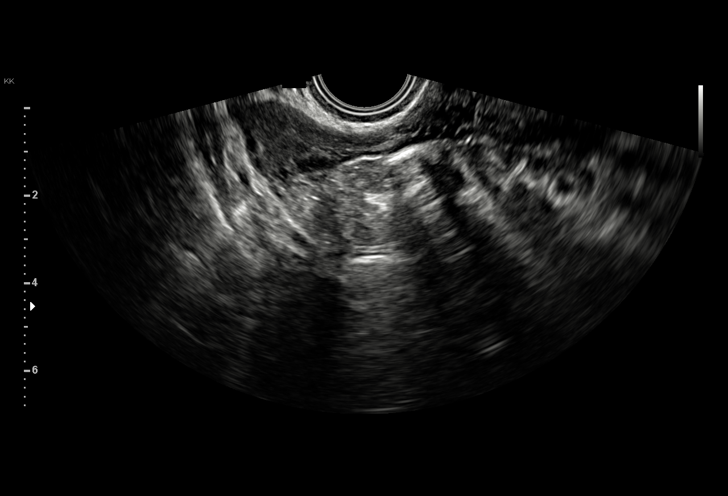
[im 41/59]
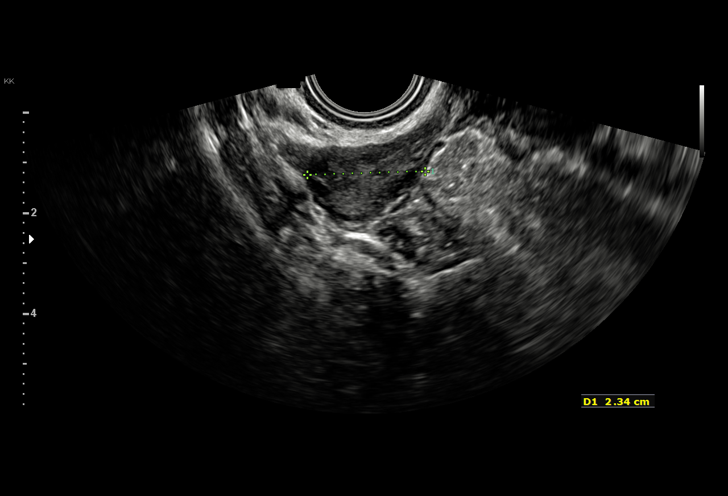
[im 46/59]
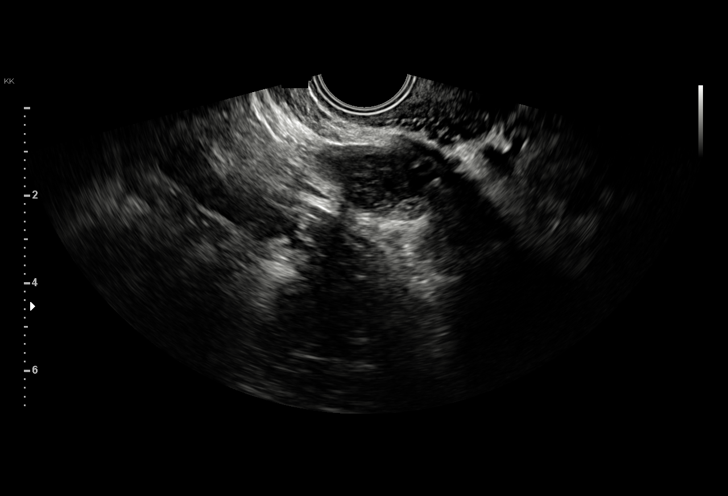
[im 50/59]
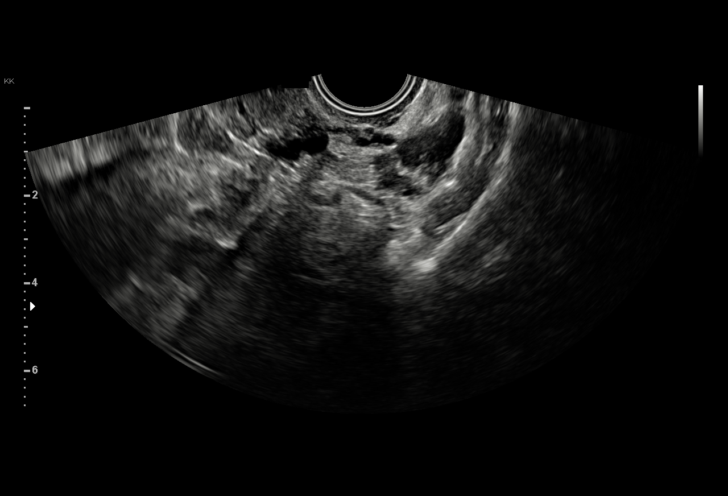
[im 54/59]
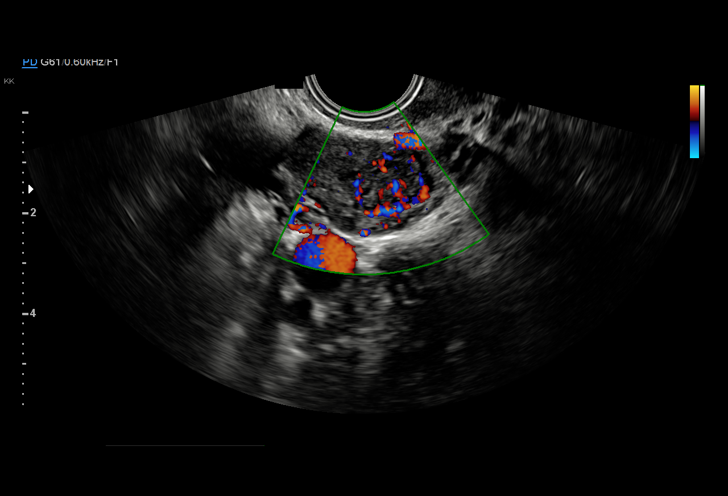
[im 59/59]
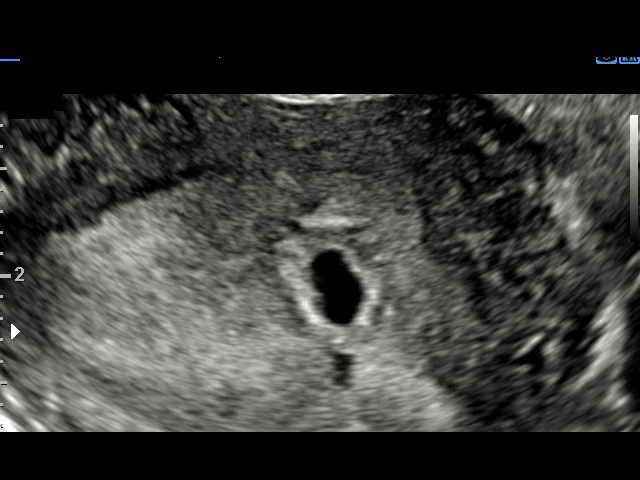

[15 of 28 positions shown; findings below may reference images not displayed]

FINDINGS: Intrauterine gestational sac: Single; irregular shape

Yolk sac:  Not Visualized.

Embryo:  Not Visualized.

MSD:  7 mm   5 w   2 d

Subchorionic hemorrhage:  None visualized.

Maternal uterus/adnexae: Both ovaries are normal in appearance. No
mass or abnormal free fluid identified.
IMPRESSION: Findings are suspicious but not yet definitive for failed pregnancy.
Recommend follow-up US in 7 days for definitive diagnosis. This
recommendation follows SRU consensus guidelines: Diagnostic Criteria
for Nonviable Pregnancy Early in the First Trimester. N Engl J Med

## 2020-08-29 ENCOUNTER — Encounter: Payer: Self-pay | Admitting: Emergency Medicine

## 2020-08-29 ENCOUNTER — Emergency Department
Admission: EM | Admit: 2020-08-29 | Discharge: 2020-08-29 | Disposition: A | Discharge status: Home / Self Care | Payer: BC Managed Care – PPO | Attending: Emergency Medicine | Admitting: Emergency Medicine

## 2020-08-29 ENCOUNTER — Other Ambulatory Visit: Payer: Self-pay

## 2020-08-29 DIAGNOSIS — M436 Torticollis: Secondary | ICD-10-CM | POA: Diagnosis not present

## 2020-08-29 DIAGNOSIS — M542 Cervicalgia: Secondary | ICD-10-CM | POA: Diagnosis not present

## 2020-08-29 DIAGNOSIS — Z87891 Personal history of nicotine dependence: Secondary | ICD-10-CM | POA: Diagnosis not present

## 2020-08-29 MED ORDER — NAPROXEN 500 MG PO TABS: 500.0000 mg | ORAL_TABLET | Freq: Two times a day (BID) | ORAL | Status: DC

## 2020-08-29 MED ORDER — LIDOCAINE 5 % EX PTCH: 1.0000 | MEDICATED_PATCH | Freq: Two times a day (BID) | CUTANEOUS | 0 refills | Status: DC

## 2020-08-29 MED ORDER — CYCLOBENZAPRINE HCL 10 MG PO TABS
10.0000 mg | ORAL_TABLET | Freq: Once | ORAL | Status: AC
  Administered 2020-08-29: 10 mg via ORAL
  Filled 2020-08-29: qty 1

## 2020-08-29 MED ORDER — ORPHENADRINE CITRATE ER 100 MG PO TB12: 100.0000 mg | ORAL_TABLET | Freq: Two times a day (BID) | ORAL | 0 refills | Status: DC

## 2020-08-29 MED ORDER — LIDOCAINE 5 % EX PTCH
1.0000 | MEDICATED_PATCH | CUTANEOUS | Status: DC
  Administered 2020-08-29: 1 via TRANSDERMAL
  Filled 2020-08-29: qty 1

## 2020-08-29 NOTE — ED Provider Notes (Addendum)
[facility] Emergency Department Provider Note   ____________________________________________   First MD Initiated Contact with Patient 08/29/20 0813     (approximate)  I have reviewed the triage vital signs and the nursing notes.   HISTORY  Chief Complaint Neck Pain    HPI Carrie Lyons is a 19 y.o. female patient awakened at 2 AM this morning complaining of right neck pain.  Patient denies any provocative incident for complaint.  Patient denies radicular component to her neck pain.  Patient decreased range of motion with left lateral movements.  Rates pain as 8/10.  Described pain as "tightness".  No palliative measures for complaint.         Past Medical History:  Diagnosis Date   Medical history non-contributory     There are no problems to display for this patient.   Past Surgical History:  Procedure Laterality Date   TYMPANOSTOMY TUBE PLACEMENT      Prior to Admission medications   Medication Sig Start Date End Date Taking? Authorizing Provider  ferrous sulfate 325 (65 FE) MG tablet Take 325 mg by mouth daily with breakfast.    [provider]  lidocaine (LIDODERM) 5 % Place 1 patch onto the skin every 12 (twelve) hours. Remove & Discard patch within 12 hours or as directed by MD 08/29/20 08/29/21  Sable Feil, PA-C  naproxen (NAPROSYN) 500 MG tablet Take 1 tablet (500 mg total) by mouth 2 (two) times daily with a meal. 08/29/20   Sable Feil, PA-C  orphenadrine (NORFLEX) 100 MG tablet Take 1 tablet (100 mg total) by mouth 2 (two) times daily. 08/29/20   Sable Feil, PA-C  promethazine (PHENERGAN) 25 MG tablet Take 1 tablet (25 mg total) by mouth every 6 (six) hours as needed for nausea or vomiting. 10/29/18   Gavin Pound, [name]    Allergies Amoxicillin and Penicillins  No family history on file.  Social History Social History   Tobacco Use   Smoking status: Former Smoker   Smokeless tobacco: Never Used   Scientific laboratory technician Use: Former  Substance Use Topics   Alcohol use: No   Drug use: Not Currently    Review of Systems Constitutional: No fever/chills Eyes: No visual changes. ENT: No sore throat. Cardiovascular: Denies chest pain. Respiratory: Denies shortness of breath. Gastrointestinal: No abdominal pain.  No nausea, no vomiting.  No diarrhea.  No constipation. Genitourinary: Negative for dysuria. Musculoskeletal: Positive for right neck pain.  Skin: Negative for rash. Neurological: Negative for headaches, focal weakness or numbness. Allergic/Immunilogical: Penicillin ____________________________________________   PHYSICAL EXAM:  VITAL SIGNS: ED Triage Vitals  Enc Vitals Group     BP 08/29/20 0738 (!) 156/106     Pulse Rate 08/29/20 0738 92     Resp 08/29/20 0738 18     Temp 08/29/20 0738 98.1 F (36.7 C)     Temp Source 08/29/20 0738 Oral     SpO2 08/29/20 0738 100 %     Weight 08/29/20 0738 145 lb (65.8 kg)     Height 08/29/20 0738 5\' 4"  (1.626 m)     Head Circumference --      Peak Flow --      Pain Score 08/29/20 0737 8     Pain Loc --      Pain Edu? --      Excl. in [place]? --     Constitutional: Alert and oriented. Well appearing and in no acute distress. Neck:No  cervical spine tenderness to palpation. Hematological/Lymphatic/Immunilogical: No cervical lymphadenopathy. Cardiovascular: Normal rate, regular rhythm. Grossly normal heart sounds.  Good peripheral circulation. Respiratory: Normal respiratory effort.  No retractions. Lungs CTAB. Musculoskeletal: No cervical spine deformity.  No guarding palpation spinal processes.  Patient has limited range of motion with left lateral movements. Neurologic:  Normal speech and language. No gross focal neurologic deficits are appreciated. No gait instability. Skin:  Skin is warm, dry and intact. No rash noted. Psychiatric: Mood and affect are normal. Speech and behavior are  normal.  ____________________________________________   LABS (all labs ordered are listed, but only abnormal results are displayed)  Labs Reviewed - No data to display ____________________________________________  EKG   ____________________________________________  RADIOLOGY I, Sable Feil, personally viewed and evaluated these images (plain radiographs) as part of my medical decision making, as well as reviewing the written report by the radiologist.  ED MD interpretation:    Official radiology report(s): No results found.  ____________________________________________   PROCEDURES  Procedure(s) performed (including Critical Care):  Procedures   ____________________________________________   INITIAL IMPRESSION / ASSESSMENT AND PLAN / ED COURSE  As part of my medical decision making, I reviewed the following data within the [facility]         Patient presents with right lateral neck pain which occurred while sleeping.  Patient denies radicular component to neck pain.  Patient physical exam is consistent with torticollis.  Patient given discharge care instruction.  Patient advised take medication as directed.      ____________________________________________   FINAL CLINICAL IMPRESSION(S) / ED DIAGNOSES  Final diagnoses:  Torticollis, acute     ED Discharge Orders         Ordered    orphenadrine (NORFLEX) 100 MG tablet  2 times daily        08/29/20 0821    lidocaine (LIDODERM) 5 %  Every 12 hours        08/29/20 0821    naproxen (NAPROSYN) 500 MG tablet  2 times daily with meals        08/29/20 3846          *Please note:  Carrie Lyons was evaluated in Emergency Department on 08/29/2020 for the symptoms described in the history of present illness. She was evaluated in the context of the global COVID-19 pandemic, which necessitated consideration that the patient might be at risk for infection with the SARS-CoV-2 virus that  causes COVID-19. Institutional protocols and algorithms that pertain to the evaluation of patients at risk for COVID-19 are in a state of rapid change based on information released by regulatory bodies including the CDC and federal and state organizations. These policies and algorithms were followed during the patient's care in the ED.  Some ED evaluations and interventions may be delayed as a result of limited staffing during and the pandemic.*   Note:  This document was prepared using Dragon voice recognition software and may include unintentional dictation errors.    Sable Feil, PA-C 08/29/20 0822    Sable Feil, PA-C 08/29/20 6599    Arta Silence, MD 08/29/20 1049

## 2020-08-29 NOTE — Discharge Instructions (Signed)
Follow discharge care instruction take medication as directed. °

## 2020-08-29 NOTE — ED Triage Notes (Signed)
Pt c/o R neck pain since 2am this morning, denies any injury, has not taken any ibuprofen or tylenol.

## 2021-03-11 ENCOUNTER — Other Ambulatory Visit: Payer: Self-pay

## 2021-03-11 ENCOUNTER — Encounter: Payer: Self-pay | Admitting: Emergency Medicine

## 2021-03-11 DIAGNOSIS — R109 Unspecified abdominal pain: Secondary | ICD-10-CM | POA: Insufficient documentation

## 2021-03-11 DIAGNOSIS — Z5321 Procedure and treatment not carried out due to patient leaving prior to being seen by health care provider: Secondary | ICD-10-CM | POA: Insufficient documentation

## 2021-03-11 DIAGNOSIS — N939 Abnormal uterine and vaginal bleeding, unspecified: Secondary | ICD-10-CM | POA: Diagnosis not present

## 2021-03-11 LAB — CBC WITH DIFFERENTIAL/PLATELET
Abs Immature Granulocytes: 0.02 10*3/uL (ref 0.00–0.07)
Basophils Absolute: 0.1 10*3/uL (ref 0.0–0.1)
Basophils Relative: 1 %
Eosinophils Absolute: 0.2 10*3/uL (ref 0.0–0.5)
Eosinophils Relative: 2 %
HCT: 36.5 % (ref 36.0–46.0)
Hemoglobin: 12.1 g/dL (ref 12.0–15.0)
Immature Granulocytes: 0 %
Lymphocytes Relative: 28 %
Lymphs Abs: 2.6 10*3/uL (ref 0.7–4.0)
MCH: 27.8 pg (ref 26.0–34.0)
MCHC: 33.2 g/dL (ref 30.0–36.0)
MCV: 83.9 fL (ref 80.0–100.0)
Monocytes Absolute: 0.6 10*3/uL (ref 0.1–1.0)
Monocytes Relative: 6 %
Neutro Abs: 5.8 10*3/uL (ref 1.7–7.7)
Neutrophils Relative %: 63 %
Platelets: 389 10*3/uL (ref 150–400)
RBC: 4.35 MIL/uL (ref 3.87–5.11)
RDW: 13.6 % (ref 11.5–15.5)
WBC: 9.2 10*3/uL (ref 4.0–10.5)
nRBC: 0 % (ref 0.0–0.2)

## 2021-03-11 LAB — BASIC METABOLIC PANEL
Anion gap: 7 (ref 5–15)
BUN: 7 mg/dL (ref 6–20)
CO2: 24 mmol/L (ref 22–32)
Calcium: 8.9 mg/dL (ref 8.9–10.3)
Chloride: 106 mmol/L (ref 98–111)
Creatinine, Ser: 0.71 mg/dL (ref 0.44–1.00)
GFR, Estimated: >60 mL/min (ref >60)
Glucose, Bld: 126 mg/dL — ABNORMAL HIGH (ref 70–99)
Potassium: 3.8 mmol/L (ref 3.5–5.1)
Sodium: 137 mmol/L (ref 135–145)

## 2021-03-11 LAB — POC URINE PREG, ED: Preg Test, Ur: NEGATIVE

## 2021-03-11 NOTE — ED Triage Notes (Signed)
Pt reports that she is having abd cramping, with bleeding that began today. Her last period was April 30th. She reports that she took a few home pregnancy test and they were positive

## 2021-03-12 ENCOUNTER — Emergency Department
Admission: EM | Admit: 2021-03-12 | Discharge: 2021-03-12 | Disposition: A | Discharge status: Home / Self Care | Payer: BC Managed Care – PPO | Attending: Emergency Medicine | Admitting: Emergency Medicine

## 2021-03-12 DIAGNOSIS — O039 Complete or unspecified spontaneous abortion without complication: Secondary | ICD-10-CM | POA: Diagnosis not present

## 2021-03-12 DIAGNOSIS — Z3202 Encounter for pregnancy test, result negative: Secondary | ICD-10-CM | POA: Diagnosis not present

## 2021-03-12 LAB — HCG, QUANTITATIVE, PREGNANCY: hCG, Beta Chain, Quant, S: 3 m[IU]/mL (ref <5)

## 2021-03-16 NOTE — Progress Notes (Signed)
PCP:  Patient, No Pcp Per (Inactive)   Chief Complaint  Patient presents with  . Gynecologic Exam    No concerns     HPI:      [name] is a 20 y.o. G1P0 whose LMP was Patient's last menstrual period was 02/06/2021 (exact date)., presents today for her NP annual examination.  Her menses are regular every 28-30 days, lasting 6 days.  Dysmenorrhea mild, occurring first 1-2 days of flow. She does not have intermenstrual bleeding.  She was pregnant in May and is having a miscarriage. Bleeding/cramping started 03/09/21 and is now tapering off. Spotting only now and cramping gone. Went to [facility] ED 03/12/21 and had serum beta HCG of 3 and was told to f/u with GYN. Documentation in [place] shows pt was in ED but there aren't any notes. Labs visible, however.   Sex activity: single partner, contraception - none. Doesn't want pregnancy but doesn't want BC.  Last Pap: never due to age Hx of STDs: none  There is no FH of breast cancer. There is no FH of ovarian cancer. The patient does do self-breast exams.  Tobacco use: The patient denies current or previous tobacco use. Alcohol use: social drinker No drug use.  Exercise: moderately active  She does get adequate calcium and Vitamin D in her diet. Gardasil completed   Past Medical History:  Diagnosis Date  . Miscarriage     Past Surgical History:  Procedure Laterality Date  . TYMPANOSTOMY TUBE PLACEMENT      History reviewed. No pertinent family history.  Social History   Socioeconomic History  . Marital status: Single    Spouse name: Not on file  . Number of children: Not on file  . Years of education: Not on file  . Highest education level: Not on file  Occupational History  . Not on file  Tobacco Use  . Smoking status: Former Research scientist (life sciences)  . Smokeless tobacco: Never Used  Vaping Use  . Vaping Use: Former  Substance and Sexual Activity  . Alcohol use: Yes  . Drug use: Not Currently  . Sexual activity: Yes    Birth  control/protection: None  Other Topics Concern  . Not on file  Social History Narrative  . Not on file   Social Determinants of Health   Financial Resource Strain: Not on file  Food Insecurity: Not on file  Transportation Needs: Not on file  Physical Activity: Not on file  Stress: Not on file  Social Connections: Not on file  Intimate Partner Violence: Not on file    No current outpatient medications on file.     ROS:  Review of Systems  Constitutional: Negative for fatigue, fever and unexpected weight change.  Respiratory: Negative for cough, shortness of breath and wheezing.   Cardiovascular: Negative for chest pain, palpitations and leg swelling.  Gastrointestinal: Negative for blood in stool, constipation, diarrhea, nausea and vomiting.  Endocrine: Negative for cold intolerance, heat intolerance and polyuria.  Genitourinary: Positive for vaginal bleeding. Negative for dyspareunia, dysuria, flank pain, frequency, genital sores, hematuria, menstrual problem, pelvic pain, urgency, vaginal discharge and vaginal pain.  Musculoskeletal: Negative for back pain, joint swelling and myalgias.  Skin: Negative for rash.  Neurological: Negative for dizziness, syncope, light-headedness, numbness and headaches.  Hematological: Negative for adenopathy.  Psychiatric/Behavioral: Negative for agitation, confusion, sleep disturbance and suicidal ideas. The patient is not nervous/anxious.    BREAST: No symptoms   Objective: BP 120/80   Ht 5\' 4"  (  1.626 m)   Wt 144 lb (65.3 kg)   LMP 02/06/2021 (Exact Date)   BMI 24.72 kg/m    Physical Exam Constitutional:      Appearance: She is well-developed.  Genitourinary:     Vulva normal.     Right Labia: No rash, tenderness or lesions.    Left Labia: No tenderness, lesions or rash.    Vaginal bleeding present.     No vaginal discharge, erythema or tenderness.      Right Adnexa: not tender and no mass present.    Left Adnexa: not  tender and no mass present.    No cervical friability or polyp.     Uterus is not enlarged or tender.  Breasts:     Right: No mass, nipple discharge, skin change or tenderness.     Left: No mass, nipple discharge, skin change or tenderness.    Neck:     Thyroid: No thyromegaly.  Cardiovascular:     Rate and Rhythm: Normal rate and regular rhythm.     Heart sounds: Normal heart sounds. No murmur heard.   Pulmonary:     Effort: Pulmonary effort is normal.     Breath sounds: Normal breath sounds.  Abdominal:     Palpations: Abdomen is soft.     Tenderness: There is no abdominal tenderness. There is no guarding or rebound.  Musculoskeletal:        General: Normal range of motion.     Cervical back: Normal range of motion.  Lymphadenopathy:     Cervical: No cervical adenopathy.  Neurological:     General: No focal deficit present.     Mental Status: She is alert and oriented to person, place, and time.     Cranial Nerves: No cranial nerve deficit.  Skin:    General: Skin is warm and dry.  Psychiatric:        Mood and Affect: Mood normal.        Behavior: Behavior normal.        Thought Content: Thought content normal.        Judgment: Judgment normal.  Vitals reviewed.     Assessment/Plan: Encounter for annual routine gynecological examination  Cervical cancer screening - Plan: Cytology - PAP  Screening for STD (sexually transmitted disease) - Plan: Cytology - PAP  Miscarriage - Plan: Beta hCG quant (ref lab)--check serum beta today. Will f/u with results and mgmt. Spotting now, no pain.   Pt declines BC. Encouraged condom use if doesn't want pregnancy.             GYN counsel adequate intake of calcium and vitamin D, diet and exercise     F/U  Return in about 1 year (around 03/17/2022).  Carrie Rohde B. Audrina Marten, PA-C 03/17/2021 9:20 AM

## 2021-03-17 ENCOUNTER — Encounter: Payer: Self-pay | Admitting: Obstetrics and Gynecology

## 2021-03-17 ENCOUNTER — Other Ambulatory Visit: Payer: Self-pay

## 2021-03-17 ENCOUNTER — Ambulatory Visit (INDEPENDENT_AMBULATORY_CARE_PROVIDER_SITE_OTHER): Payer: BC Managed Care – PPO | Admitting: Obstetrics and Gynecology

## 2021-03-17 ENCOUNTER — Other Ambulatory Visit (HOSPITAL_COMMUNITY)
Admission: RE | Admit: 2021-03-17 | Discharge: 2021-03-17 | Disposition: A | Discharge status: Home / Self Care | Payer: BC Managed Care – PPO | Source: Ambulatory Visit | Attending: Obstetrics and Gynecology | Admitting: Obstetrics and Gynecology

## 2021-03-17 VITALS — BP 120/80 | Ht 64.0 in | Wt 144.0 lb

## 2021-03-17 DIAGNOSIS — Z113 Encounter for screening for infections with a predominantly sexual mode of transmission: Secondary | ICD-10-CM | POA: Diagnosis not present

## 2021-03-17 DIAGNOSIS — O039 Complete or unspecified spontaneous abortion without complication: Secondary | ICD-10-CM | POA: Diagnosis not present

## 2021-03-17 DIAGNOSIS — Z124 Encounter for screening for malignant neoplasm of cervix: Secondary | ICD-10-CM

## 2021-03-17 DIAGNOSIS — Z01419 Encounter for gynecological examination (general) (routine) without abnormal findings: Secondary | ICD-10-CM | POA: Diagnosis not present

## 2021-03-17 NOTE — Patient Instructions (Signed)
I value your feedback and you entrusting us with your care. If you get a [facility] patient survey, I would appreciate you taking the time to let us know about your experience today. Thank you! ? ? ?

## 2021-03-18 LAB — BETA HCG QUANT (REF LAB): hCG Quant: <1 m[IU]/mL

## 2021-03-19 LAB — CYTOLOGY - PAP
Chlamydia: NEGATIVE
Comment: NEGATIVE
Comment: NORMAL
Diagnosis: NEGATIVE
Neisseria Gonorrhea: NEGATIVE

## 2021-04-08 ENCOUNTER — Ambulatory Visit: Payer: BC Managed Care – PPO | Admitting: Obstetrics and Gynecology

## 2021-04-13 NOTE — Progress Notes (Deleted)
    Patient, No Pcp Per (Inactive)   No chief complaint on file.   HPI:      Carrie Lyons is a 20 y.o. G2P0020 whose LMP was No LMP recorded., presents today for ***    Past Medical History:  Diagnosis Date   Miscarriage     Past Surgical History:  Procedure Laterality Date   TYMPANOSTOMY TUBE PLACEMENT      No family history on file.  Social History   Socioeconomic History   Marital status: Single    Spouse name: Not on file   Number of children: Not on file   Years of education: Not on file   Highest education level: Not on file  Occupational History   Not on file  Tobacco Use   Smoking status: Former    Pack years: 0.00   Smokeless tobacco: Never  Vaping Use   Vaping Use: Former  Substance and Sexual Activity   Alcohol use: Yes   Drug use: Not Currently   Sexual activity: Yes    Birth control/protection: None  Other Topics Concern   Not on file  Social History Narrative   Not on file   Social Determinants of Health   Financial Resource Strain: Not on file  Food Insecurity: Not on file  Transportation Needs: Not on file  Physical Activity: Not on file  Stress: Not on file  Social Connections: Not on file  Intimate Partner Violence: Not on file    No outpatient medications prior to visit.   No facility-administered medications prior to visit.      ROS:  Review of Systems BREAST: No symptoms   OBJECTIVE:   Vitals:  There were no vitals taken for this visit.  Physical Exam  Results: No results found for this or any previous visit (from the past 24 hour(s)).   Assessment/Plan: No diagnosis found.    No orders of the defined types were placed in this encounter.     No follow-ups on file.  Abbie Jablon B. Loletta Harper, PA-C 04/13/2021 5:02 PM

## 2021-04-14 ENCOUNTER — Ambulatory Visit: Payer: Self-pay | Admitting: Obstetrics and Gynecology

## 2022-05-18 ENCOUNTER — Emergency Department (HOSPITAL_BASED_OUTPATIENT_CLINIC_OR_DEPARTMENT_OTHER): Payer: Self-pay

## 2022-05-18 ENCOUNTER — Encounter (HOSPITAL_BASED_OUTPATIENT_CLINIC_OR_DEPARTMENT_OTHER): Payer: Self-pay | Admitting: Emergency Medicine

## 2022-05-18 ENCOUNTER — Emergency Department (HOSPITAL_BASED_OUTPATIENT_CLINIC_OR_DEPARTMENT_OTHER)
Admission: EM | Admit: 2022-05-18 | Discharge: 2022-05-18 | Disposition: A | Discharge status: Home / Self Care | Payer: Self-pay | Attending: Emergency Medicine | Admitting: Emergency Medicine

## 2022-05-18 ENCOUNTER — Other Ambulatory Visit: Payer: Self-pay

## 2022-05-18 DIAGNOSIS — D259 Leiomyoma of uterus, unspecified: Secondary | ICD-10-CM | POA: Insufficient documentation

## 2022-05-18 DIAGNOSIS — R1031 Right lower quadrant pain: Secondary | ICD-10-CM

## 2022-05-18 LAB — CBC
HCT: 30.8 % — ABNORMAL LOW (ref 36.0–46.0)
Hemoglobin: 9.2 g/dL — ABNORMAL LOW (ref 12.0–15.0)
MCH: 21.6 pg — ABNORMAL LOW (ref 26.0–34.0)
MCHC: 29.9 g/dL — ABNORMAL LOW (ref 30.0–36.0)
MCV: 72.5 fL — ABNORMAL LOW (ref 80.0–100.0)
Platelets: 432 10*3/uL — ABNORMAL HIGH (ref 150–400)
RBC: 4.25 MIL/uL (ref 3.87–5.11)
RDW: 16.4 % — ABNORMAL HIGH (ref 11.5–15.5)
WBC: 5.2 10*3/uL (ref 4.0–10.5)
nRBC: 0 % (ref 0.0–0.2)

## 2022-05-18 LAB — URINALYSIS, ROUTINE W REFLEX MICROSCOPIC
Bilirubin Urine: NEGATIVE
Glucose, UA: NEGATIVE mg/dL
Hgb urine dipstick: NEGATIVE
Ketones, ur: NEGATIVE mg/dL
Leukocytes,Ua: NEGATIVE
Nitrite: NEGATIVE
Specific Gravity, Urine: 1.016 (ref 1.005–1.030)
pH: 6 (ref 5.0–8.0)

## 2022-05-18 LAB — WET PREP, GENITAL
Clue Cells Wet Prep HPF POC: NONE SEEN
Sperm: NONE SEEN
Trich, Wet Prep: NONE SEEN
WBC, Wet Prep HPF POC: >=10 — AB (ref <10)
Yeast Wet Prep HPF POC: NONE SEEN

## 2022-05-18 LAB — COMPREHENSIVE METABOLIC PANEL
ALT: 13 U/L (ref 0–44)
AST: 19 U/L (ref 15–41)
Albumin: 4.4 g/dL (ref 3.5–5.0)
Alkaline Phosphatase: 61 U/L (ref 38–126)
Anion gap: 10 (ref 5–15)
BUN: 7 mg/dL (ref 6–20)
CO2: 23 mmol/L (ref 22–32)
Calcium: 8.8 mg/dL — ABNORMAL LOW (ref 8.9–10.3)
Chloride: 107 mmol/L (ref 98–111)
Creatinine, Ser: 0.67 mg/dL (ref 0.44–1.00)
GFR, Estimated: >60 mL/min (ref >60)
Glucose, Bld: 99 mg/dL (ref 70–99)
Potassium: 3.8 mmol/L (ref 3.5–5.1)
Sodium: 140 mmol/L (ref 135–145)
Total Bilirubin: 0.2 mg/dL — ABNORMAL LOW (ref 0.3–1.2)
Total Protein: 7.5 g/dL (ref 6.5–8.1)

## 2022-05-18 LAB — LIPASE, BLOOD: Lipase: 85 U/L — ABNORMAL HIGH (ref 11–51)

## 2022-05-18 LAB — PREGNANCY, URINE: Preg Test, Ur: NEGATIVE

## 2022-05-18 MED ORDER — IOHEXOL 300 MG/ML  SOLN
100.0000 mL | Freq: Once | INTRAMUSCULAR | Status: AC | PRN
  Administered 2022-05-18: 75 mL via INTRAVENOUS

## 2022-05-18 NOTE — Discharge Instructions (Signed)
You have been seen and evaluated for your symptoms.  CT scan today shows you have uterine fibroid.  You may follow-up with your primary care doctor with an OB/GYN for further assessment.  Please avoid heavy alcohol use as it can be harmful for your health.  You may take over-the-counter medication as needed for pain.  Return to the ER if your symptoms worsen or if you have any other concern.

## 2022-05-18 NOTE — ED Notes (Signed)
RT note: Pt. placed in room accompanied with friend v/s updated, both given blankets, notified of call bell location.

## 2022-05-18 NOTE — ED Triage Notes (Signed)
Pt here with c/o abd pain rlq pain that started yesterday , some nausea no vomiting

## 2022-05-18 NOTE — ED Notes (Signed)
Patient transported to CT 

## 2022-05-18 NOTE — ED Provider Notes (Signed)
[facility] EMERGENCY DEPT Provider Note   CSN: [id number] Arrival date & time: 05/18/22  1007     History  Chief Complaint  Patient presents with   Abdominal Pain    Carrie Lyons is a 21 y.o. female.   Abdominal Pain   Carrie Lyons is a 21 yo female w/o pertinent medical history presents to ED for RLQ  abd pain x2 days. She reports pain started in the periumbilical region yesterday and progressed to the RLQ. She felt febrile yesterday and checked her temp at 100F. She endorses anorexia, nausea, and seeing pinkish hue to her stool. Today, she mentions the pain is traveling to her right thigh. Denies vomiting, diarrhea, sick contacts, recent illness, eating unfamiliar food, dysuria, and hematuria. Last BM was yesterday. LMP was April 30, 2022. She is sexually active with one partner, not always using barrier protection. Denies Chest pain, SOB. She drinks and smokes on regular basis.   Home Medications Prior to Admission medications   Not on File      Allergies    Amoxicillin and Penicillins    Review of Systems   Review of Systems  Gastrointestinal:  Positive for abdominal pain.    Physical Exam Updated Vital Signs BP (!) 156/109   Pulse 85   Temp 98.4 F (36.9 C) (Oral)   Resp 18   Ht '5\' 4"'$  (1.626 m)   Wt 65.3 kg   LMP 04/30/2022 (Exact Date)   SpO2 100%   BMI 24.72 kg/m  Physical Exam  ED Results / Procedures / Treatments   Labs (all labs ordered are listed, but only abnormal results are displayed) Labs Reviewed  LIPASE, BLOOD - Abnormal; Notable for the following components:      Result Value   Lipase 85 (*)    All other components within normal limits  COMPREHENSIVE METABOLIC PANEL - Abnormal; Notable for the following components:   Calcium 8.8 (*)    Total Bilirubin 0.2 (*)    All other components within normal limits  CBC - Abnormal; Notable for the following components:   Hemoglobin 9.2 (*)    HCT 30.8 (*)    MCV 72.5 (*)    MCH 21.6  (*)    MCHC 29.9 (*)    RDW 16.4 (*)    Platelets 432 (*)    All other components within normal limits  URINALYSIS, ROUTINE W REFLEX MICROSCOPIC - Abnormal; Notable for the following components:   Protein, ur TRACE (*)    All other components within normal limits  PREGNANCY, URINE    EKG None  Radiology No results found.  Procedures Pelvic exam  Date/Time: 05/18/2022 1:08 PM  Performed by: Domenic Moras, PA-C Authorized by: Domenic Moras, PA-C  Consent: Verbal consent obtained. Comments: Chaperone present during exam.  No inguinal lymphadenopathy or inguinal hernia noted.  Normal external genitalia.  No significant discomfort with speculum insertion.  Moderate amount of vaginal discharge noted in vaginal vault.  Closed cervical os.  On bimanual exam no adnexal tenderness or cervical motion tenderness.       Medications Ordered in ED Medications - No data to display  ED Course/ Medical Decision Making/ A&P                           Medical Decision Making Amount and/or Complexity of Data Reviewed Labs: ordered. Radiology: ordered.  Risk Prescription drug management.   BP (!) 156/109   Pulse 85  Temp 98.4 F (36.9 C) (Oral)   Resp 18   Ht '5\' 4"'$  (1.626 m)   Wt 65.3 kg   LMP 04/30/2022 (Exact Date)   SpO2 100%   BMI 24.72 kg/m   12:32 PM This is a generally healthy 21 year old female who presents for evaluation of abdominal pain.  Patient reports she developed pain to her mid abdomen that started yesterday but now travels towards her right lower quadrant.  Pain is sharp and achy 6 out of 10.  She also endorses chills and decrease in appetite.  She noted some tinge of blood in her stool.  She endorses mild nausea without vomiting.  She does not endorse fever, chest pain, shortness of breath, productive cough.  She denies having urinary symptoms no vaginal bleeding or vaginal discharge.  Her last menstrual period was approximately 2 weeks ago.  She is sexually active  not using protection.  Her mom at bedside also provides some additional history.  Patient does smoke and does consume alcohol on a fairly regular basis.  Patient denies any pain with sexual activity.  No specific treatment tried at home.  12:34 PM Labs obtained independently reviewed and interpreted by me.  Urinalysis without signs of urinary tract infection.  Pregnancy test is negative.  Elevated lipase of 85.  Patient does admits to alcohol use.  She however does not have any left upper quadrant abdominal tenderness to suggest active pancreatitis.  Electrolyte panels are reassuring.  She does have a hemoglobin of 9.2 and it appears her baseline hemoglobin is around 11 from a year ago.  She denies any heavy bleeding except did notice some tinge of pink blood in her stool on occasion.  This patient presents to the ED for concern of abd pain, this involves an extensive number of treatment options, and is a complaint that carries with it a high risk of complications and morbidity.  The differential diagnosis includes appendicitis, pancreatitis, colitis, diverticulitis, cholecystitis, UTI, kidney stone, gastritis, ovarian torsion, PID, TOA  Co morbidities that complicate the patient evaluation alcohol use Additional history obtained:  Additional history obtained from mother External records from outside source obtained and reviewed including EMR including labs and imaging  Lab Tests:  I Ordered, and personally interpreted labs.  The pertinent results include:  as above  Imaging Studies ordered:  I ordered imaging studies including abd/pelvis CT I independently visualized and interpreted imaging which showed no acute finding I agree with the radiologist interpretation  Cardiac Monitoring:  The patient was maintained on a cardiac monitor.  I personally viewed and interpreted the cardiac monitored which showed an underlying rhythm of: NSR    Test Considered: as above  Critical  Interventions: pelvic exam  Problem List / ED Course: RLQ abd pain  Reevaluation:  After the interventions noted above, I reevaluated the patient and found that they have :improved  Social Determinants of Health: tobacco and alcohol use  Dispostion:  After consideration of the diagnostic results and the patients response to treatment, I feel that the patent would benefit from outpt f/u.         Final Clinical Impression(s) / ED Diagnoses Final diagnoses:  Right lower quadrant abdominal pain  Uterine leiomyoma, unspecified location    Rx / DC Orders ED Discharge Orders     None         Domenic Moras, PA-C 05/18/22 1606    Elnora Morrison, MD 05/20/22 2326

## 2022-05-18 NOTE — ED Notes (Signed)
Mali - RN aware of pt's BP

## 2022-05-19 LAB — RPR: RPR Ser Ql: NONREACTIVE

## 2022-05-19 LAB — GC/CHLAMYDIA PROBE AMP (~~LOC~~) NOT AT ARMC
Chlamydia: NEGATIVE
Comment: NEGATIVE
Comment: NORMAL
Neisseria Gonorrhea: NEGATIVE

## 2022-05-26 ENCOUNTER — Ambulatory Visit: Payer: Self-pay | Admitting: Obstetrics and Gynecology

## 2022-05-26 NOTE — Progress Notes (Deleted)
    Patient, No Pcp Per   No chief complaint on file.   HPI:      [name] is a 21 y.o. G2P0020 whose LMP was Patient's last menstrual period was 04/30/2022 (exact date)., presents today for ***  RLQ pain, ewent to ED  Had neg STD testing, neg UPT   05/18/22 abd/pelvic CT with Reproductive: Several intramural fibroids measuring up to 2.6 cm. The ovaries are unremarkable. Right ovarian corpus luteum.   There are no problems to display for this patient.   Past Surgical History:  Procedure Laterality Date   TYMPANOSTOMY TUBE PLACEMENT      No family history on file.  Social History   Socioeconomic History   Marital status: Single    Spouse name: Not on file   Number of children: Not on file   Years of education: Not on file   Highest education level: Not on file  Occupational History   Not on file  Tobacco Use   Smoking status: Former   Smokeless tobacco: Never  Vaping Use   Vaping Use: Former  Substance and Sexual Activity   Alcohol use: Yes   Drug use: Not Currently   Sexual activity: Yes    Birth control/protection: None  Other Topics Concern   Not on file  Social History Narrative   Not on file   Social Determinants of Health   Financial Resource Strain: Not on file  Food Insecurity: Not on file  Transportation Needs: Not on file  Physical Activity: Not on file  Stress: Not on file  Social Connections: Not on file  Intimate Partner Violence: Not on file    No outpatient medications prior to visit.   No facility-administered medications prior to visit.      ROS:  Review of Systems BREAST: No symptoms   OBJECTIVE:   Vitals:  LMP 04/30/2022 (Exact Date)   Physical Exam  Results: No results found for this or any previous visit (from the past 24 hour(s)).   Assessment/Plan: No diagnosis found.    No orders of the defined types were placed in this encounter.     No follow-ups on file.  Carrie Callan B. Khristian Seals,  PA-C 05/26/2022 12:22 PM

## 2023-02-21 ENCOUNTER — Telehealth: Payer: Self-pay | Admitting: Obstetrics and Gynecology

## 2023-02-21 NOTE — Telephone Encounter (Signed)
Tried reaching out to patient to get her scheduled for her annual, vm box full.

## 2023-05-03 NOTE — Telephone Encounter (Signed)
As of 05/03/2023, pt had not contacted office to schedule the annual exam.

## 2023-12-17 NOTE — Progress Notes (Deleted)
 PCP:  Patient, No Pcp Per   No chief complaint on file.    HPI:      [name] is a 23 y.o. G1P0 whose LMP was No LMP recorded., presents today for her NP annual examination.  Her menses are regular every 28-30 days, lasting 6 days.  Dysmenorrhea mild, occurring first 1-2 days of flow. She does not have intermenstrual bleeding.  Sex activity: single partner, contraception - none. Doesn't want pregnancy but doesn't want BC.  Last Pap: 03/17/21 Results were NILM Hx of STDs: none  There is no FH of breast cancer. There is no FH of ovarian cancer. The patient does do self-breast exams.  Tobacco use: The patient denies current or previous tobacco use. Alcohol use: social drinker No drug use.  Exercise: moderately active  She does get adequate calcium and Vitamin D in her diet. Gardasil completed   Past Medical History:  Diagnosis Date   Miscarriage     Past Surgical History:  Procedure Laterality Date   TYMPANOSTOMY TUBE PLACEMENT      No family history on file.  Social History   Socioeconomic History   Marital status: Single    Spouse name: Not on file   Number of children: Not on file   Years of education: Not on file   Highest education level: Not on file  Occupational History   Not on file  Tobacco Use   Smoking status: Former   Smokeless tobacco: Never  Vaping Use   Vaping status: Former  Substance and Sexual Activity   Alcohol use: Yes   Drug use: Not Currently   Sexual activity: Yes    Birth control/protection: None  Other Topics Concern   Not on file  Social History Narrative   Not on file   Social Drivers of Health   Financial Resource Strain: Not on file  Food Insecurity: Not on file  Transportation Needs: Not on file  Physical Activity: Not on file  Stress: Not on file  Social Connections: Not on file  Intimate Partner Violence: Not on file    No current outpatient medications on file.     ROS:  Review of Systems   Constitutional:  Negative for fatigue, fever and unexpected weight change.  Respiratory:  Negative for cough, shortness of breath and wheezing.   Cardiovascular:  Negative for chest pain, palpitations and leg swelling.  Gastrointestinal:  Negative for blood in stool, constipation, diarrhea, nausea and vomiting.  Endocrine: Negative for cold intolerance, heat intolerance and polyuria.  Genitourinary:  Positive for vaginal bleeding. Negative for dyspareunia, dysuria, flank pain, frequency, genital sores, hematuria, menstrual problem, pelvic pain, urgency, vaginal discharge and vaginal pain.  Musculoskeletal:  Negative for back pain, joint swelling and myalgias.  Skin:  Negative for rash.  Neurological:  Negative for dizziness, syncope, light-headedness, numbness and headaches.  Hematological:  Negative for adenopathy.  Psychiatric/Behavioral:  Negative for agitation, confusion, sleep disturbance and suicidal ideas. The patient is not nervous/anxious.    BREAST: No symptoms   Objective: There were no vitals taken for this visit.   Physical Exam Constitutional:      Appearance: She is well-developed.  Genitourinary:     Vulva normal.     Right Labia: No rash, tenderness or lesions.    Left Labia: No tenderness, lesions or rash.    Vaginal bleeding present.     No vaginal discharge, erythema or tenderness.      Right Adnexa: not tender and no  mass present.    Left Adnexa: not tender and no mass present.    No cervical friability or polyp.     Uterus is not enlarged or tender.  Breasts:    Right: No mass, nipple discharge, skin change or tenderness.     Left: No mass, nipple discharge, skin change or tenderness.  Neck:     Thyroid: No thyromegaly.  Cardiovascular:     Rate and Rhythm: Normal rate and regular rhythm.     Heart sounds: Normal heart sounds. No murmur heard. Pulmonary:     Effort: Pulmonary effort is normal.     Breath sounds: Normal breath sounds.  Abdominal:      Palpations: Abdomen is soft.     Tenderness: There is no abdominal tenderness. There is no guarding or rebound.  Musculoskeletal:        General: Normal range of motion.     Cervical back: Normal range of motion.  Lymphadenopathy:     Cervical: No cervical adenopathy.  Neurological:     General: No focal deficit present.     Mental Status: She is alert and oriented to person, place, and time.     Cranial Nerves: No cranial nerve deficit.  Skin:    General: Skin is warm and dry.  Psychiatric:        Mood and Affect: Mood normal.        Behavior: Behavior normal.        Thought Content: Thought content normal.        Judgment: Judgment normal.  Vitals reviewed.     Assessment/Plan: Encounter for annual routine gynecological examination  Cervical cancer screening - Plan: Cytology - PAP  Screening for STD (sexually transmitted disease) - Plan: Cytology - PAP  Miscarriage - Plan: Beta hCG quant (ref lab)--check serum beta today. Will f/u with results and mgmt. Spotting now, no pain.   Pt declines BC. Encouraged condom use if doesn't want pregnancy.             GYN counsel adequate intake of calcium and vitamin D, diet and exercise     F/U  No follow-ups on file.  Dougles Kimmey B. Allayah Raineri, PA-C 12/17/2023 5:51 PM

## 2023-12-18 ENCOUNTER — Ambulatory Visit: Payer: Self-pay | Admitting: Obstetrics and Gynecology

## 2023-12-18 DIAGNOSIS — Z113 Encounter for screening for infections with a predominantly sexual mode of transmission: Secondary | ICD-10-CM

## 2023-12-18 DIAGNOSIS — Z01419 Encounter for gynecological examination (general) (routine) without abnormal findings: Secondary | ICD-10-CM

## 2023-12-18 DIAGNOSIS — Z124 Encounter for screening for malignant neoplasm of cervix: Secondary | ICD-10-CM

## 2023-12-20 ENCOUNTER — Encounter: Payer: Self-pay | Admitting: Obstetrics and Gynecology
